# Patient Record
Sex: Male | Born: 1935 | Race: White | Hispanic: No | Smoking: Never smoker
Health system: Southern US, Community
[De-identification: ages and names within clinical notes are randomized; demographics above are authoritative.]

## PROBLEM LIST (undated history)

## (undated) DIAGNOSIS — E785 Hyperlipidemia, unspecified: Secondary | ICD-10-CM

## (undated) DIAGNOSIS — I10 Essential (primary) hypertension: Secondary | ICD-10-CM

## (undated) DIAGNOSIS — G620 Drug-induced polyneuropathy: Principal | ICD-10-CM

## (undated) DIAGNOSIS — I639 Cerebral infarction, unspecified: Secondary | ICD-10-CM

## (undated) DIAGNOSIS — Z950 Presence of cardiac pacemaker: Secondary | ICD-10-CM

## (undated) DIAGNOSIS — F32A Depression, unspecified: Secondary | ICD-10-CM

## (undated) DIAGNOSIS — F329 Major depressive disorder, single episode, unspecified: Secondary | ICD-10-CM

## (undated) DIAGNOSIS — R569 Unspecified convulsions: Secondary | ICD-10-CM

## (undated) DIAGNOSIS — I509 Heart failure, unspecified: Secondary | ICD-10-CM

## (undated) DIAGNOSIS — C61 Malignant neoplasm of prostate: Secondary | ICD-10-CM

## (undated) DIAGNOSIS — F0390 Unspecified dementia without behavioral disturbance: Secondary | ICD-10-CM

## (undated) HISTORY — DX: Presence of cardiac pacemaker: Z95.0

## (undated) HISTORY — DX: Drug-induced polyneuropathy: G62.0

## (undated) HISTORY — DX: Malignant neoplasm of prostate: C61

---
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
## 1999-04-21 ENCOUNTER — Encounter: Payer: Self-pay | Admitting: Emergency Medicine

## 1999-04-21 ENCOUNTER — Emergency Department (HOSPITAL_COMMUNITY): Admission: EM | Admit: 1999-04-21 | Discharge: 1999-04-21 | Payer: Self-pay | Admitting: Emergency Medicine

## 1999-04-28 ENCOUNTER — Emergency Department (HOSPITAL_COMMUNITY): Admission: EM | Admit: 1999-04-28 | Discharge: 1999-04-28 | Payer: Self-pay | Admitting: Emergency Medicine

## 2001-04-18 HISTORY — PX: PROSTATE BIOPSY: SHX241

## 2001-09-07 ENCOUNTER — Encounter: Admission: RE | Admit: 2001-09-07 | Discharge: 2001-09-07 | Payer: Self-pay | Admitting: Urology

## 2001-09-07 ENCOUNTER — Encounter: Payer: Self-pay | Admitting: Urology

## 2001-09-17 ENCOUNTER — Ambulatory Visit: Admission: RE | Admit: 2001-09-17 | Discharge: 2001-12-16 | Payer: Self-pay | Admitting: Radiation Oncology

## 2002-01-28 ENCOUNTER — Ambulatory Visit: Admission: RE | Admit: 2002-01-28 | Discharge: 2002-01-28 | Payer: Self-pay | Admitting: Radiation Oncology

## 2003-04-19 DIAGNOSIS — I639 Cerebral infarction, unspecified: Secondary | ICD-10-CM

## 2003-04-19 HISTORY — DX: Cerebral infarction, unspecified: I63.9

## 2003-07-02 ENCOUNTER — Encounter: Admission: RE | Admit: 2003-07-02 | Discharge: 2003-07-02 | Payer: Self-pay | Admitting: Family Medicine

## 2003-07-13 ENCOUNTER — Emergency Department (HOSPITAL_COMMUNITY): Admission: EM | Admit: 2003-07-13 | Discharge: 2003-07-13 | Payer: Self-pay | Admitting: Emergency Medicine

## 2004-04-07 ENCOUNTER — Inpatient Hospital Stay (HOSPITAL_COMMUNITY): Admission: EM | Admit: 2004-04-07 | Discharge: 2004-04-13 | Payer: Self-pay | Admitting: Emergency Medicine

## 2004-04-07 ENCOUNTER — Encounter (INDEPENDENT_AMBULATORY_CARE_PROVIDER_SITE_OTHER): Payer: Self-pay | Admitting: Cardiology

## 2004-04-08 ENCOUNTER — Ambulatory Visit: Payer: Self-pay | Admitting: Physical Medicine & Rehabilitation

## 2004-04-13 ENCOUNTER — Inpatient Hospital Stay
Admission: RE | Admit: 2004-04-13 | Discharge: 2004-04-21 | Payer: Self-pay | Admitting: Physical Medicine & Rehabilitation

## 2004-05-31 ENCOUNTER — Encounter: Admission: RE | Admit: 2004-05-31 | Discharge: 2004-07-29 | Payer: Self-pay | Admitting: Family Medicine

## 2004-06-02 ENCOUNTER — Encounter
Admission: RE | Admit: 2004-06-02 | Discharge: 2004-08-27 | Payer: Self-pay | Admitting: Physical Medicine & Rehabilitation

## 2004-06-03 ENCOUNTER — Ambulatory Visit: Payer: Self-pay | Admitting: Physical Medicine & Rehabilitation

## 2004-08-27 ENCOUNTER — Encounter
Admission: RE | Admit: 2004-08-27 | Discharge: 2004-11-25 | Payer: Self-pay | Admitting: Physical Medicine & Rehabilitation

## 2004-08-31 ENCOUNTER — Ambulatory Visit: Payer: Self-pay | Admitting: Physical Medicine & Rehabilitation

## 2007-01-24 ENCOUNTER — Emergency Department (HOSPITAL_COMMUNITY): Admission: EM | Admit: 2007-01-24 | Discharge: 2007-01-24 | Payer: Self-pay | Admitting: General Surgery

## 2010-09-03 NOTE — Discharge Summary (Signed)
[name] Holmes, [name] Holmes NO.:  [id number]   MEDICAL RECORD NO.:  [id number]          PATIENT TYPE:  INP   LOCATION:  3017                         FACILITY:  [facility]   PHYSICIAN:  Deirdre Peer. Polite, M.D. DATE OF BIRTH:  1924/08/14   DATE OF ADMISSION:  04/06/2004  DATE OF DISCHARGE:  04/12/2004                                 DISCHARGE SUMMARY   DISCHARGE DIAGNOSES:  1.  Acute right brain cerebrovascular accident with minimal paresis mainly      manifested by gait ataxia.  Seen in consultation by neurology,      rehabilitation recommended.  2.  Hypertension.  3.  Known history of seizure disorder.  4.  Elevated homocystine level.  5.  Hyperlipidemia.  6.  Probable gout in left great toe, on empiric treatment.  Please note,      uric acid within normal limits, as the patient has had some troubles      with falls and x-ray will be ordered to rule out fracture.  Results      pending at time of this dictation.   MEDICATIONS:  1.  Dilantin 200 mg q.h.s.  2.  Hydrochlorothiazide 12.5 mg p.o. daily.  3.  Lisinopril 20 mg daily.  4.  Aggrenox one daily until April 21, 2004, and then one p.o. b.i.d.  5.  Zocor 20 mg daily.  6.  Protonix one tab daily.  7.  Tranxene 7.5 mg q.h.s. p.r.n.  8.  Colchicine 0.6 mg p.o. daily.  9.  Indocin 50 mg one p.o. t.i.d. x5 days.   DISPOSITION:  The patient is awaiting acceptance to rehabilitation.   CONSULTATIONS:  Dr. Thad Ranger, neurology.   STUDIES:  The patient had a carotid ultrasound which showed no significant  carotid stenosis.  Urinalysis was negative.  MRI showed cerebrovascular  accident in the right parietal area, showed stenotic disease in both  internal carotid arteries, precavernous and cavernous regions, multiple  small carotid artery aneurysms.  Lipids were checked:  Total cholesterol  205, LDL 120, HDL 53.  BMET within normal limits.  Homocystine level was  elevated at 17.  Ultrasound of the left leg was negative  for DVT.   HISTORY OF PRESENT ILLNESS:  A 77 year old male with known medical problems  of hypertension and seizure disorder presented for evaluation for complaint  of feeling dizzy and frequent falls.  In the ED, the patient was evaluated.  Admission was deemed necessary for concern for CVA.  Please see dictated H&P  for further details.   PAST MEDICAL HISTORY:  As stated above.   MEDICATIONS ON ADMISSION:  As stated in the H&P.   PAST MEDICAL HISTORY:  1.  Asthma.  2.  Hypertension.  3.  History of prostate CA diagnosed in 2003, treated with radiation for      eight weeks.   HOSPITAL COURSE:  #1 -  The patient was admitted to a medical floor bed for  evaluation and treatment of possible CVA.  The patient had multiple studies,  as stated above.  CAT scan, carotid ultrasound, MRI, 2-D echocardiogram,  homocystine  level.  The patient's carotid ultrasound was negative for  stenosis.  The patient's 2-D echocardiogram was a limited study, unable to  assess left ventricular function.  MRI significant for right parietal  cerebrovascular accident.  The patient was also seen in consultation by  speech therapy who recommended a regular diet.  The patient was also seen in  consultation by cardiology.  It was recommended the patient have aggressive  risk factor modification.  Aggrenox was added, as well as lipid-lowering  agent was added to the patient's treatment regimen.  The patient was seen  also in consultation by rehabilitation who felt that the patient could  benefit from rehabilitation.  At the time of this dictation, the patient is  awaiting a bed offer in rehabilitation.  #2 -  SEIZURE DISORDER:  Stable.  The patient will continue Dilantin.  #3 -  PROBABLE GOUT:  Treated empirically with NSAID and colchicine.  Please  note, uric acid was within normal limits.  #4 -  ELEVATED LIPIDS:  Will be treated as outlined.  Recommend followup  LFT's.  #5 -  ELEVATED HOMOCYSTINE:  Hold  Foltx____________treatment regimen.      [name] Holmes   RDP/MEDQ  D:  04/12/2004  T:  04/12/2004  Job:  [id number]

## 2010-09-03 NOTE — Assessment & Plan Note (Signed)
MEDICAL RECORD NUMBER:  [id number]   DATE OF BIRTH:  Aug 19, 1924   DATE OF SERVICE:  July 01, 2004   INTERVAL HISTORY:  [age]-year-old male with right subcortical CVA  causing left greater than right hemiataxia and truncal ataxia; he was at  [facility] from April 13, 2004 to April 21, 2004 discharged at a  modified independent level; has been going to outpatient therapy; he is  accompanied by his niece who is his Midwife.  He lives  independently, he is a widower and worked up until about a year ago.   He continues to walk outside the home even close to a mile at a time.   He has had no change in review of systems, past medical history or medical  history since last treatment.  He has been getting physical therapy twice a  week at [facility].   EXAMINATION:  Blood pressure 146/81, pulse 72, respiratory rate 17, O2  saturation 96% room air.   General:  Elderly male; kyphotic posture; left medial rectus palsy evident.  He has 4/5 strength bilateral upper and lower extremities.  He has mild  ataxia on the right side compared to left.   Ambulation - is using a cane; he actually is able to put the cane down; he  has some mild valgus deformity bilateral knees, crouched gait, no loss of  balance.   IMPRESSION:  1.  Balance disorder secondary to cerebrovascular accident still with mild      ataxia.  2.  History of left foot gout, no recurrence but does take suppressive      colchicine 0.6 once daily.   PLAN:  1.  Continue PT x1 more month.  I will see him back in 1 month.  2.  Advised to hold colchicine and he can resume if he starts getting some      more toe pain.      AEK/MedQ  D:  07/01/2004 17:10:09  T:  07/01/2004 20:49:44  Job #:  [id number]

## 2010-09-03 NOTE — H&P (Signed)
NAMETRACE, WIRICK NO.:  [id number]   MEDICAL RECORD NO.:  [id number]          PATIENT TYPE:  EMS   LOCATION:  MAJO                         FACILITY:  [facility]   PHYSICIAN:  Sherin Quarry, MD      DATE OF BIRTH:  27-Oct-1924   DATE OF ADMISSION:  04/06/2004  DATE OF DISCHARGE:                                HISTORY & PHYSICAL   HISTORY OF PRESENT ILLNESS:  [name] Holmes is a 77 year old man who indicates  that he has a past history of seizure disorder but has had no seizures for  the last 13 years.  He is maintained chronically on Dilantin 200 mg  alternating with 300 mg daily. The dose of Dilantin has not been changed.  One week ago he began to be increasingly weak, dizzy and ataxic. In the last  48 hours he has become extremely ataxic and is unable to get out of a chair  or bed. Evaluation in the emergency room reveals severe wide based ataxia  with falling when the patient is not supported.  A CT scan of the head was  negative for stroke but showed right internal carotid artery disease may  possibly be present. The patient lives by himself and currently cannot care  for himself, and is therefore admitted for evaluation of the ataxia  symptoms.   PAST MEDICAL HISTORY:   CURRENT MEDICATIONS:  1.  Dilantin 200 mg alternating with 300 mg daily.  2.  Lisinopril 20 mg daily.  3.  Tranxene 7.5 mg daily.  4.  Hydrochlorothiazide 25 mg daily.  5.  Darvocet p.r.n. for pain.   MEDICAL ILLNESSES:  1.  Asthma. The patient has a past history of asthma with apparently has not      been bothering him very much since Toprol was discontinued.  2.  Prostate cancer. In 2003 the patient was diagnosed with prostate cancer      and received 8 weeks of radiation treatment. This has apparently been      supervised by Dr. Patsi Sears.  3.  Seizures. The history here is somewhat unclear but as mentioned      previously there have been no recent seizures.   OPERATIONS:  The patient  had an appendectomy in 1939, he also had a prostate  biopsy by Dr. Patsi Sears in 2003.   FAMILY HISTORY:  A sister who lives in [place] has arthritis and obesity.  He also has two nieces.   SOCIAL HISTORY:  He states he does not smoke. He denies use of alcohol or  drugs. As mentioned previously he lives by himself and it sounds like his  ability to care for himself under normal circumstances is somewhat tenuous.   REVIEW OF SYSTEMS:  Is otherwise negative. He has had no recent problems  with headache, visual blurring, nausea or vomiting.   PHYSICAL EXAMINATION:  VITAL SIGNS: Blood pressure is 132/80, pulse is 93,  respirations are 20.  HEENT:  Exam is within normal limits.  CHEST:  Clear to auscultation and percussion.  CARDIOVASCULAR:  Normal S1, S2, no murmurs, rubs, or gallops.  ABDOMEN:  Benign, normal bowel sounds without masses or tenderness. There is  no guarding or rebound.  NEUROLOGIC TESTING:  Cranial nerves, motor, sensory and cerebellar testing  is unremarkable except that on cerebellar testing the patient exhibits past  pointing when using his left arm. It is hard to tell whether this is  secondary to weakness or to difficulty with dysmetria.  On examination of  the patient's gait he is wide based, ataxic and is unable to walk without  assistance.  EXTREMITIES:  No cyanosis or edema.   LABORATORY DATA:  Dilantin level is 16.9. White count is 13,800, hemoglobin  14.2. Glucose is 119, BUN is 23, electrolytes are normal.  CT scan of the  brain showed no acute disease, it did however suggest the possibility of  some right carotid artery disease.   IMPRESSION:  1.  Ataxia, severe x1 week, rule out stroke.  2.  History of seizures, Dilantin therapy, no seizures x13 years.  3.  Hypertension.  4.  History of asthma.  5.  Chronic anxiety.  6.  History of prostate cancer, radiation therapy.   PLAN:  The patient will be admitted at this time for further evaluation. An   MRI scan of the brain will be obtained. Will also do an MRA scan and 2D  echocardiogram, carotid Doppler studies will be obtained.  I am going to  check a free Dilantin level, I think it might be reasonable to hold the  patient's Dilantin at least until the Dilantin level gets down to the 10  range and then it might be a consideration to stop Dilantin altogether since  he has had no seizures.  PT, OT and case manager consults will be obtained.       SY/MEDQ  D:  04/07/2004  T:  04/07/2004  Job:  [id number]   cc:   L. Lupe Carney, M.D.  [address] [place]  [place] [address]  Fax: [phone]

## 2010-09-03 NOTE — Discharge Summary (Signed)
NAMESABASTION, HRDLICKA                ACCOUNT NO.:  [id number]   MEDICAL RECORD NO.:  [id number]          PATIENT TYPE:  ORB   LOCATION:  4527                         FACILITY:  [facility]   PHYSICIAN:  Erick Colace, M.D.DATE OF BIRTH:  [birth year]   DATE OF ADMISSION:  04/13/2004  DATE OF DISCHARGE:  04/21/2004                                 DISCHARGE SUMMARY   DISCHARGE DIAGNOSES:  1.  Right cerebrovascular accident.  2.  Elevated homocysteine.  3.  Elevated cholesterol.  4.  History of hypertension.  5.  Left foot gout.  6.  History of seizure disorder.   HISTORY AND PHYSICAL:  The patient is a 77 year old white male with past  medical history of seizure disorders on chronic Dilantin who presents on  04/06/04 with elevated weakness, dizziness and ataxia.  Head CT is negative  for CVA.  Carotid Doppler, no significant ACS stenosis.  MRI revealed a  right parotid globe and CVA and transcranial Doppler is pending at the time  of this dictation.  PT at this time, the patient is transferring 150 feet  with rolling walker, ambulating with supervision level.  Bed mobility  supervision level.  Transfers supervision level.  Venous Doppler's are  performed on 04/11/04 and demonstrated no DVT.  The patient also had  exacerbation of gout while hospitalized.  The patient was transferred to  [facility] on 04/13/04 for more therapy.   PAST MEDICAL HISTORY:  1.  Asthma.  2.  Prostate cancer.  3.  Seizure disorder.  4.  Hypertension.  5.  Chronic anxiety.   PAST SURGICAL HISTORY:  Appendectomy.   FAMILY HISTORY:  Mother deceased from CVA.  Father deceased from CVA.  One  sister living and healthy.   SOCIAL HISTORY:  The patient lives alone in a one-level home in [place],  [place].  Four steps to enter.  Independent prior to admission.  Has  step-children, sister and nieces locally.   MEDICATIONS PRIOR TO ADMISSION:  1.  Toprol XL 100 mg daily.  2.  Dilantin 200 mg daily.  3.  HCTZ 25 mg daily.  4.  Aspirin 81 mg daily.  5.  Lisinopril unknown dose daily.   ALLERGIES:  ACTONEL.   HOSPITAL COURSE:  Mr. Ostin Mathey was admitted to [facility] subacute  department on 04/13/04 where he received more than hour of therapy.  Overall  Mr. Ulice Bold progressed very well while in subacute.  He had no new  neurological deficits while in rehab.  He was discharged at a modified  independent level, able to ambulate greater than 150 feet with a rolling  walker.  The patient remained on Aggrenox for CVA prophylaxis.  No bleeding  complications noted.  The patient had no significant weakness in  extremities.  Occasional slurred speech but this was possibly baseline.  The  patient continued to take Foltx daily for elevated homocysteine.  The  patient also continued to take Dilantin as needed for seizure disorder.  The  patient had no seizures while in rehab.  The patient was on Lovenox 40 mg  daily for DVT prophylaxis.  This was discontinued on 04/21/04.  The patient  met all his goals in rehab and it was thought he could be discharged in the  modified level.   The patient also remained on Zocor 20 mg at night for history of elevated  cholesterol.  Liver function tests remained stable.  His latest AST was 17,  ALT 18, alkaline phosphatase 92.  The patient remained on Indocin as well as  colchicine for gout.  Indocin was discontinued.  The patient continued to  take colchicine 0.6 mg daily at home.  Blood pressure remained under  reasonably good control on HCTZ 12.5 mg daily as well as lisinopril 20 mg  daily.  He is to continue to monitor blood pressure at discharge.  Occasionally, will exacerbate gout.  The patient still has ataxia on the  left and motor skills were 5/5 bilateral upper extremities and bilateral  lower extremities.  The patient completed all goals.  He was recommended for  discharge home on 04/22/03.  There were no medical issues during  rehabilitation.   Latest labs were sodium level 135, potassium 3.8, chloride  102, CO2 26, glucose 130, BUN 19, creatinine 1.2.  Latest hemoglobin 11.7,  hematocrit 32.8, white blood cell count 7.8, platelet count 384,000.  Hemoccult swab of stool was negative on 04/14/04.  The patient also had a  Dilantin level on 04/14/04 that was 7.0.  Culture on 04/13/04 ____________.   At the time of discharge all vitals were stable.  Blood pressure 116/52,  respiratory rate 20, pulse 74, temperature 98.2.  At the time of discharge  the patient is able to ambulate approximately 300 feet with walker,  transfers modified independently, bed mobility modified independently.  Able  to perform all ADLs modified independently.  PT thought the patient was safe  to be at home by himself.  He was discharged on overall modified independent  level.  Due to family concerns the patient will have social worker regarding  safety issues.  At the time of discharge the patient's physical exam was  basically unremarkable.  Discharge medications include colchicine 0.6 mg one  tablet daily, Dilantin 1 mg two tabs at bedtime, HCTZ 12.5 mg daily,  Prinivil 20 mg daily, Aggrenox 1 p.o. twice daily, Zocor 20 mg one tablet at  bedtime, Foltx 1 a day.  Do not take aspirin or Toprol.  Use walker.  No  driving, no  drinking alcohol, no smoking.  Drink plenty of liquids and limit caffeine.  Diet is low cholesterol, low fat.  He is to receive Home Health for PT/OT,  nurse and social worker.  Follow up with Dr. Lupe Carney in 2-6 weeks.  Follow up with Dr. Wynn Banker on 05/31/04 at 2:45.  Follow up with Dr.  Sharene Skeans in two weeks, call for appointment.     Loyc   LB/MEDQ  D:  04/21/2004  T:  04/21/2004  Job:  [id number]   cc:   Deanna Artis. Sharene Skeans, M.D.  [address]  [address]  [place]  [place] [address]  Fax: [phone]   Sigmund I. Patsi Sears, M.D.  [address] [place]  [place] [address]  Fax: [phone]   L. Lupe Carney, M.D.  [address] [place]  [place] [address]  Fax: [phone]

## 2010-09-03 NOTE — Consult Note (Signed)
[name] Holmes, [name] Holmes                ACCOUNT NO.:  [id number]   MEDICAL RECORD NO.:  [id number]          PATIENT TYPE:  INP   LOCATION:  3017                         FACILITY:  [facility]   PHYSICIAN:  Casimiro Needle L. Reynolds, M.D.DATE OF BIRTH:  1925/01/17   DATE OF CONSULTATION:  04/07/2004  DATE OF DISCHARGE:                                   CONSULTATION   REQUESTING PHYSICIAN:  Deirdre Peer. Polite, M.D.   REASON FOR EVALUATION:  Stroke.   HISTORY OF PRESENT ILLNESS:  This is an inpatient consultation evaluation  for this existing [facility] patient, a 77 year old man  followed in our office for many years for epilepsy which has been extremely  well controlled on Dilantin.  Per his report, he has taken Dilantin for over  50 years, and he has not had a seizure in about 13 years.  Our records  indicate that his last seizure was in February 1994.  According to our  notes, the patient does have a known organic gait disorder, although I do  not have precise details on that.  The patient was brought to the emergency  room yesterday by a friend because he was unable to get around well in his  house.  He reports that he has had an event a week ago when he was out near  his trash can and his legs suddenly became weak.  However, this was  transient, and he felt well a couple days after that.  Then, four days ago  he awoke in the morning and lost his balance and fell, hitting his head  against the table.  From that time forward, he has noticed that he has not  been getting around very well.  For instance, he walked to a restaurant and  had to be helped into the restaurant by a hostess.  Yesterday, a friend came  over and noted that he was having difficulty.  The friend brought him some  Ensure, which he was able to drink but which did not help very much, and he  was brought in for further evaluation.  The patient denies any particular  focal weakness or numbness or any loss of  consciousness.   PAST MEDICAL HISTORY:  1.  Remarkable for a long history of seizure as above.  2.  He also has a history of hypertension, but denies any problems with high      cholesterol, high blood sugars, or heart disease.  3.  He has a remote history of prostate cancer.   FAMILY/SOCIAL/REVIEW OF SYSTEMS:  As outlined in the admission H&P from  earlier today.   MEDICATIONS:  1.  Prior to admission, he was taking Toprol 100 mg daily.  2.  Tranxene 7.5 mg at bedtime.  3.  Dilantin 200 mg at bedtime alternating with 300 mg at bedtime.  4.  HCTZ.  5.  Aspirin.  6.  Lisinopril.   PHYSICAL EXAMINATION:  TEMPERATURE:  97.1.  BLOOD PRESSURE:  139/77.  PULSE:  87.  RESPIRATIONS:  18.  O2 SATURATION:  91% on room air.  GENERAL:  This  is a frail-appearing man supine on the hospital bed in no  evident distress.  HEAD:  Cranium is normocephalic and atraumatic.  Oropharynx is benign.  NECK:  Supple, without carotid bruits.  HEART:  Regular rate and rhythm, without murmurs.  NEUROLOGIC:  Mental status:  He is awake and alert.  He is oriented to time,  place, and person.  Recent and remote memory are intact.  Attention span,  concentration, and fund of knowledge are appropriate.  Speech is fluent and  not dysarthric.  He has no difficulty in naming objects and can repeat  without difficulty.  His speech is not particularly dysarthric.  Cranial  nerves II-XII:  Funduscopic exam:  Disks benign.  Pupils equal and briskly  reactive.  There is a right exotropia which is longstanding, according to  our notes, but extraocular movements are full, without nystagmus, and visual  fields are full to confrontation.  Hearing is intact to finger rub.  Facial  sensation is diminished a little bit on the left side to pinprick.  There is  no facial asymmetry, but there is definite facial weakness on the left  compared to the right.  Tongue and palate move in the midline.  Motor:  Normal bulk and tone.  A  mild left hemiparesis is present, left arm greater  than leg.  Sensation:  Diminished pinprick sensation on the left leg versus  the right, although both feet are diminished a little bit.  Pinprick in the  upper extremities is intact.  Coordination:  Rapid movements are performed  well.  There is a little bit of dysmetria on finger-to-nose testing on the  left, not on the right.  Gait:  He arises from a chair slowly.  His stance  is wide based.  He has great difficulty standing with a narrow base or  performing a Romberg maneuver.  He requires moderate assistance to walk and  appears to favor his right leg when walking.   LABORATORY REVIEW:  CBC:  White count 13.8, hemoglobin 14.2.  Chemistries  are remarkable for BUN and creatinine of 23 and 1.3, respectively, along  with a glucose of 119.  CT of the head was personally reviewed.  The study  demonstrates a mild atrophy, a significant ventriculomegaly out of  proportion to the atrophy, and some small vessel disease, but no acute  findings.  MRI and MRA of the brain is also personally reviewed, and this  does indicate a couple of small cortical strokes in the right frontoparietal  area with some bilateral distal carotid disease, right greater than left.  Preliminary carotid Doppler report is negative.  Echocardiogram report is  pending.   IMPRESSION:  1.  Acute right brain stroke with mild left hemiparesis and sensory changes.  2.  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  However, I do not know how long his      imaging studies have looked this way.   PLAN:  I would recommend reducing his Dilantin to 200 mg at bedtime and following free levels.  Of note, he did have a level of 17, which was drawn  in the evening, and that is a pretty  high trough level for an older  gentleman.  It may be wise  to lower the medication even further.  As far as  stroke, his routine stroke workup is underway, and he has been seen by the  therapist.  Would add a transcranial Doppler and a rehab consult as well as  a fasting lipid panel and homocysteine levels.  Stroke service to follow.  Thank you for the consultation.       MLR/MEDQ  D:  04/07/2004  T:  04/08/2004  Job:  [id number]

## 2010-09-03 NOTE — Group Therapy Note (Signed)
MEDICAL RECORD NUMBER:  [id number]   REASON FOR VISIT:  CVA, balance disorder, now finished up with physical  therapy.  Date of last visit was July 01, 2004.   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  Truncal ataxia was at [facility] until  April 21, 2004.  Discharged at modified independent level.  Has completed  outpatient therapy.  About a month ago, he has had no new complications.  He  has no pain complaints. He walks 20 minutes at a time.  He can climb steps.  He does not drive.  He uses a cane.  He has some numbness in the fingers and  left hand.  No other changes.   REVIEW OF SYSTEMS:  Since last visit, no new studies or new physicians.  He  has had some decreased hearing in the left ear as of July 31, 2004.   PHYSICAL EXAMINATION:  VITAL SIGNS:  Blood pressure 132/87, pulse 68,  respirations 18, O2 saturation 93% on room air.  NEUROLOGICAL:  Gait:  He has no toe drag or knee instability.  He is no  longer using a cane inside the house.  He has positive dysdiadochokinesis on  rapid alternating supination, pronation, left upper extremity greater than  right upper extremity.  His left finger-to-nose-to-finger has some moderate  dysmetria.  Right finger-to-nose-to-finger has minimal dysmetria.  The heel-  to-shin has minimal dysmetria on the left side and normal on the right side.  His motor strength is 4+/5 bilateral deltoid, biceps, triceps and grip, as  well as, hip flexion, knee extension and dorsiflexion.   IMPRESSION:  Cerebrovascular accident producing subcortical hemiataxia left  side, improving, functionally at an independent level, no falls, no other  complications.   PLAN:  Will follow up with Dr. Lupe Carney for primary care and Dr. Billie Ruddy for his neurologic care.  I will see him back on a p.r.n. basis.      AEK/MedQ  D:  08/31/2004 12:08:04  T:  08/31/2004 12:28:49  Job #:   [id number]   cc:   Deanna Artis. Sharene Skeans, M.D.  [address]  [address]  [place]  [place] [address]  Fax: [phone]   L. Lupe Carney, M.D.  [address] [place]  [place] [address]  Fax: [phone]

## 2010-09-03 NOTE — Assessment & Plan Note (Signed)
DATE OF BIRTH:  1924-09-03   MEDICAL RECORD NUMBER:  [id number]   HISTORY:  [name] is a 77 year old male with right subcortical CVA causing  left greater than right hemiataxia/truncal ataxia.  He was at [facility] from April 13, 2004 to April 21, 2004 discharged home at a  modified independent level using a walker, home health has come out, they  have upgraded him to a cane.  He missed about 2-week lapse in time between  home health and outpatient therapy per his Power of Healthcare Attorney who  is also his niece.   He has had no new medical complications in the interval period of time.  He  has seen Dr. August Saucer.   MEDICATIONS:  Medications include colchicine, hydrochlorothiazide,  lisinopril, Aggrenox, Foltx, Dilantin and Zocor.   ALLERGIES:  ACTONEL.   COMPLAINTS:  No pain complaints.   SOCIAL HISTORY:  He lives alone, widower, last worked sometime in February  2005.   REVIEW OF SYSTEMS:  Seizure disorder 1991 last seizure, some confusion  related to stroke, hearing loss, poor sleep, anxiety, no falls.   EXAMINATION:  Blood pressure 120/66, pulse 66, respiratory rate is 18.  Ambulates with a cane.  Affect is alert, bright, appearance is otherwise as  an elderly male with slight kyphotic posture.  He does have valgus deformity  at the right knee when ambulating.  Motor strength is 4/5 bilateral  deltoids, biceps, triceps, grip as well as hip flexion, knee extension,  ankle dorsiflexion.   He has moderate deficits left finger-nose-finger, mild deficits right finger-  nose-finger.  Heel-to-shin had mild deficits bilaterally.   Deep tendon reflexes are 2+ bilateral upper and lower extremities.  No  evidence of clonus.  No passive resistance of tone.   IMPRESSION:  Right subcortical cerebrovascular accident causing left greater  than right hemiataxia as well as truncal ataxia.   PLAN:  1.  Continue outpatient therapy.  2.  I will see him back in 1 month.  3.  No change in medications from my standpoint.      AEK/MedQ  D:  06/03/2004 16:32:59  T:  06/03/2004 19:58:31  Job #:  [id number]   cc:   L. Lupe Carney, M.D.  [address] [place]  [place] [address]  Fax: [phone]   Deanna Artis. Sharene Skeans, M.D.  [address]  [address]  [place]  [place] [address]  Fax: [phone]   Sigmund I. Patsi Sears, M.D.  [address]  [place]  [place] [address]  Fax: [phone]

## 2010-11-08 ENCOUNTER — Emergency Department (HOSPITAL_COMMUNITY): Payer: Medicare Other

## 2010-11-08 ENCOUNTER — Encounter (HOSPITAL_COMMUNITY): Payer: Self-pay

## 2010-11-08 ENCOUNTER — Inpatient Hospital Stay (HOSPITAL_COMMUNITY)
Admission: EM | Admit: 2010-11-08 | Discharge: 2010-11-15 | DRG: 093 | Disposition: A | Discharge status: Rehabilitation Facility | Payer: Medicare Other | Attending: Internal Medicine | Admitting: Internal Medicine

## 2010-11-08 DIAGNOSIS — E876 Hypokalemia: Secondary | ICD-10-CM | POA: Diagnosis not present

## 2010-11-08 DIAGNOSIS — L570 Actinic keratosis: Secondary | ICD-10-CM | POA: Diagnosis present

## 2010-11-08 DIAGNOSIS — I10 Essential (primary) hypertension: Secondary | ICD-10-CM | POA: Diagnosis present

## 2010-11-08 DIAGNOSIS — R29810 Facial weakness: Secondary | ICD-10-CM | POA: Diagnosis present

## 2010-11-08 DIAGNOSIS — W19XXXA Unspecified fall, initial encounter: Secondary | ICD-10-CM | POA: Diagnosis present

## 2010-11-08 DIAGNOSIS — T420X5A Adverse effect of hydantoin derivatives, initial encounter: Secondary | ICD-10-CM | POA: Diagnosis present

## 2010-11-08 DIAGNOSIS — Z9181 History of falling: Secondary | ICD-10-CM

## 2010-11-08 DIAGNOSIS — N400 Enlarged prostate without lower urinary tract symptoms: Secondary | ICD-10-CM | POA: Diagnosis present

## 2010-11-08 DIAGNOSIS — G40909 Epilepsy, unspecified, not intractable, without status epilepticus: Secondary | ICD-10-CM | POA: Diagnosis present

## 2010-11-08 DIAGNOSIS — Z8673 Personal history of transient ischemic attack (TIA), and cerebral infarction without residual deficits: Secondary | ICD-10-CM

## 2010-11-08 DIAGNOSIS — R279 Unspecified lack of coordination: Principal | ICD-10-CM | POA: Diagnosis present

## 2010-11-08 DIAGNOSIS — Z8546 Personal history of malignant neoplasm of prostate: Secondary | ICD-10-CM

## 2010-11-08 HISTORY — DX: Cerebral infarction, unspecified: I63.9

## 2010-11-08 LAB — URINALYSIS, ROUTINE W REFLEX MICROSCOPIC
Glucose, UA: NEGATIVE mg/dL
Hgb urine dipstick: NEGATIVE
Leukocytes, UA: NEGATIVE
Nitrite: NEGATIVE
Protein, ur: NEGATIVE mg/dL
Specific Gravity, Urine: 1.011 (ref 1.005–1.030)
Urobilinogen, UA: 0.2 mg/dL (ref 0.0–1.0)
pH: 6.5 (ref 5.0–8.0)

## 2010-11-08 LAB — CK TOTAL AND CKMB (NOT AT ARMC)
CK, MB: 3.2 ng/mL (ref 0.3–4.0)
Total CK: 45 U/L (ref 7–232)

## 2010-11-09 ENCOUNTER — Inpatient Hospital Stay (HOSPITAL_COMMUNITY): Payer: Medicare Other

## 2010-11-09 ENCOUNTER — Other Ambulatory Visit (HOSPITAL_COMMUNITY): Payer: Medicare Other

## 2010-11-09 DIAGNOSIS — R269 Unspecified abnormalities of gait and mobility: Secondary | ICD-10-CM

## 2010-11-09 LAB — CBC
HCT: 39.2 % (ref 39.0–52.0)
Hemoglobin: 14.4 g/dL (ref 13.0–17.0)
MCH: 35.2 pg — ABNORMAL HIGH (ref 26.0–34.0)
MCHC: 36.7 g/dL — ABNORMAL HIGH (ref 30.0–36.0)
MCV: 95.8 fL (ref 78.0–100.0)
Platelets: 253 10*3/uL (ref 150–400)
RBC: 4.09 MIL/uL — ABNORMAL LOW (ref 4.22–5.81)
RDW: 12.6 % (ref 11.5–15.5)
WBC: 6.8 10*3/uL (ref 4.0–10.5)

## 2010-11-09 LAB — CK TOTAL AND CKMB (NOT AT ARMC)
CK, MB: 2.8 ng/mL (ref 0.3–4.0)
Relative Index: INVALID (ref 0.0–2.5)
Relative Index: INVALID (ref 0.0–2.5)
Relative Index: INVALID (ref 0.0–2.5)
Total CK: 39 U/L (ref 7–232)
Total CK: 34 U/L (ref 7–232)
Total CK: 48 U/L (ref 7–232)

## 2010-11-09 LAB — GLUCOSE, CAPILLARY: Glucose-Capillary: 148 mg/dL — ABNORMAL HIGH (ref 70–99)

## 2010-11-09 LAB — PROTIME-INR
INR: 1.02 (ref 0.00–1.49)
Prothrombin Time: 13.6 seconds (ref 11.6–15.2)

## 2010-11-09 LAB — DIFFERENTIAL
Basophils Absolute: 0.1 10*3/uL (ref 0.0–0.1)
Basophils Relative: 1 % (ref 0–1)
Eosinophils Absolute: 0.2 10*3/uL (ref 0.0–0.7)
Eosinophils Relative: 2 % (ref 0–5)
Lymphocytes Relative: 18 % (ref 12–46)
Lymphs Abs: 1.2 10*3/uL (ref 0.7–4.0)
Monocytes Absolute: 0.7 10*3/uL (ref 0.1–1.0)
Monocytes Relative: 10 % (ref 3–12)
Neutro Abs: 4.7 10*3/uL (ref 1.7–7.7)
Neutrophils Relative %: 69 % (ref 43–77)

## 2010-11-09 LAB — COMPREHENSIVE METABOLIC PANEL
ALT: 15 U/L (ref 0–53)
AST: 20 U/L (ref 0–37)
Albumin: 3.7 g/dL (ref 3.5–5.2)
CO2: 27 mEq/L (ref 19–32)
Calcium: 9.5 mg/dL (ref 8.4–10.5)
Chloride: 100 mEq/L (ref 96–112)
Creatinine, Ser: 1.13 mg/dL (ref 0.50–1.35)
Potassium: 3.5 mEq/L (ref 3.5–5.1)
Sodium: 138 mEq/L (ref 135–145)
Total Bilirubin: 0.4 mg/dL (ref 0.3–1.2)
Total Protein: 6.9 g/dL (ref 6.0–8.3)

## 2010-11-09 LAB — PHENYTOIN LEVEL, TOTAL
Phenytoin Lvl: 27.5 ug/mL — ABNORMAL HIGH (ref 10.0–20.0)
Phenytoin Lvl: 23.6 ug/mL — ABNORMAL HIGH (ref 10.0–20.0)

## 2010-11-09 LAB — TROPONIN I: Troponin I: <0.3 ng/mL (ref <0.30)

## 2010-11-09 LAB — PRO B NATRIURETIC PEPTIDE: Pro B Natriuretic peptide (BNP): 230 pg/mL (ref 0–450)

## 2010-11-10 LAB — URINE CULTURE: Culture  Setup Time: 201207240050

## 2010-11-10 LAB — PHENYTOIN LEVEL, TOTAL: Phenytoin Lvl: 21.3 ug/mL — ABNORMAL HIGH (ref 10.0–20.0)

## 2010-11-11 LAB — COMPREHENSIVE METABOLIC PANEL
Alkaline Phosphatase: 103 U/L (ref 39–117)
BUN: 26 mg/dL — ABNORMAL HIGH (ref 6–23)
CO2: 31 mEq/L (ref 19–32)
GFR calc Af Amer: >60 mL/min (ref >60)
GFR calc non Af Amer: 58 mL/min — ABNORMAL LOW (ref >60)
Glucose, Bld: 110 mg/dL — ABNORMAL HIGH (ref 70–99)
Potassium: 4.3 mEq/L (ref 3.5–5.1)
Total Bilirubin: 0.3 mg/dL (ref 0.3–1.2)
Total Protein: 6.5 g/dL (ref 6.0–8.3)

## 2010-11-11 LAB — PHENYTOIN LEVEL, TOTAL: Phenytoin Lvl: 15 ug/mL (ref 10.0–20.0)

## 2010-11-11 LAB — CBC
HCT: 38.8 % — ABNORMAL LOW (ref 39.0–52.0)
Hemoglobin: 13.9 g/dL (ref 13.0–17.0)
MCHC: 35.8 g/dL (ref 30.0–36.0)
RBC: 4.03 MIL/uL — ABNORMAL LOW (ref 4.22–5.81)

## 2010-11-12 LAB — DIFFERENTIAL
Basophils Absolute: 0.1 10*3/uL (ref 0.0–0.1)
Basophils Relative: 1 % (ref 0–1)
Eosinophils Absolute: 0.4 10*3/uL (ref 0.0–0.7)
Eosinophils Relative: 4 % (ref 0–5)
Monocytes Absolute: 1 10*3/uL (ref 0.1–1.0)
Monocytes Relative: 10 % (ref 3–12)
Neutro Abs: 6.2 10*3/uL (ref 1.7–7.7)

## 2010-11-12 LAB — COMPREHENSIVE METABOLIC PANEL
ALT: 14 U/L (ref 0–53)
Albumin: 3.3 g/dL — ABNORMAL LOW (ref 3.5–5.2)
Alkaline Phosphatase: 107 U/L (ref 39–117)
Calcium: 9.9 mg/dL (ref 8.4–10.5)
GFR calc Af Amer: >60 mL/min (ref >60)
Potassium: 3.8 mEq/L (ref 3.5–5.1)
Sodium: 141 mEq/L (ref 135–145)
Total Protein: 6.7 g/dL (ref 6.0–8.3)

## 2010-11-12 LAB — CBC
MCH: 34.5 pg — ABNORMAL HIGH (ref 26.0–34.0)
MCHC: 35.5 g/dL (ref 30.0–36.0)
Platelets: 277 10*3/uL (ref 150–400)
RDW: 12.8 % (ref 11.5–15.5)

## 2010-11-12 LAB — PHENYTOIN LEVEL, TOTAL: Phenytoin Lvl: 17.9 ug/mL (ref 10.0–20.0)

## 2010-11-13 LAB — CBC
HCT: 38.5 % — ABNORMAL LOW (ref 39.0–52.0)
Hemoglobin: 13.6 g/dL (ref 13.0–17.0)
MCHC: 35.3 g/dL (ref 30.0–36.0)
RBC: 4 MIL/uL — ABNORMAL LOW (ref 4.22–5.81)

## 2010-11-13 LAB — DIFFERENTIAL
Basophils Absolute: 0.1 10*3/uL (ref 0.0–0.1)
Basophils Relative: 1 % (ref 0–1)
Lymphocytes Relative: 19 % (ref 12–46)
Monocytes Absolute: 1.1 10*3/uL — ABNORMAL HIGH (ref 0.1–1.0)
Monocytes Relative: 12 % (ref 3–12)
Neutro Abs: 5.8 10*3/uL (ref 1.7–7.7)
Neutrophils Relative %: 65 % (ref 43–77)

## 2010-11-13 LAB — COMPREHENSIVE METABOLIC PANEL
ALT: 14 U/L (ref 0–53)
Alkaline Phosphatase: 104 U/L (ref 39–117)
BUN: 27 mg/dL — ABNORMAL HIGH (ref 6–23)
CO2: 29 mEq/L (ref 19–32)
GFR calc Af Amer: 57 mL/min — ABNORMAL LOW (ref >60)
GFR calc non Af Amer: 47 mL/min — ABNORMAL LOW (ref >60)
Glucose, Bld: 103 mg/dL — ABNORMAL HIGH (ref 70–99)
Potassium: 3.9 mEq/L (ref 3.5–5.1)
Sodium: 134 mEq/L — ABNORMAL LOW (ref 135–145)
Total Bilirubin: 0.2 mg/dL — ABNORMAL LOW (ref 0.3–1.2)

## 2010-11-13 LAB — PHENYTOIN LEVEL, TOTAL: Phenytoin Lvl: 17.4 ug/mL (ref 10.0–20.0)

## 2010-11-13 NOTE — Discharge Summary (Signed)
[name], [name] NO.:  [id number]  MEDICAL RECORD NO.:  [id number]  LOCATION:  3009                         FACILITY:  [facility]  PHYSICIAN:  Talmage Nap, MD  DATE OF BIRTH:  1924/09/15  DATE OF ADMISSION:  11/08/2010 DATE OF DISCHARGE:                        DISCHARGE SUMMARY - REFERRING   DATE OF DISCHARGE:  Undetermined.  However, the patient will be discharged as soon as rehab facility is obtained.  PRIMARY CARE PHYSICIAN:  Dr. Clovis Riley.  CONSULTANT INVOLVED IN THIS CASE:  Neurology, Dr. Thad Ranger.  DISCHARGE DIAGNOSES: 1. 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 2. Dilantin toxicity. 3. History of seizure disorder. 4. History of cerebrovascular accident. 5. Hypertension. 6. Actinic keratosis. 7. History of asthma. 8. History of prostate cancer. 9. History of recurrent falls.  The patient is an 77 year old Caucasian male with history of seizure disorder and recurrent falls, who was admitted to the hospital on November 08, 2010 by Dr. Mauro Kaufmann with history of unsteady gait and fall.  The patient was said to have denied any premonitory symptoms prior to the onset of fall.  He denied any chest pain or shortness of breath.  The patient was said to be having unsteady gait, observed to have right facial droop.  There was no weakness, seizures.  The patient was said to admit blurry vision, but denied any chest, denied any shortness of breath, denied any nausea or vomiting.  The gait was said to be persistently unsteady and subsequently brought to the emergency room to be evaluated.  PREADMISSION MEDICATIONS: 1. Calcium 600 mg two tablets p.o. daily. 2. Dilantin 100 mg two tablet daily at bedtime. 3. Dipyridamole 25 mg p.o. b.i.d. 4. Folic acid 1 mg p.o. daily. 5. Hydrochlorothiazide 25 mg p.o. daily. 6. Lisinopril 40 mg p.o. daily. 7. Simvastatin 20 mg p.o. daily.  ALLERGIES:  Has no known allergies.  PAST SURGICAL  HISTORY: 1. Appendectomy. 2. Cataract surgery. 3. Tonsillectomy.  SOCIAL HISTORY:  Negative for alcohol or tobacco use.  The patient lives at home by himself.  FAMILY HISTORY:  Not documented.  REVIEW OF SYSTEMS:  Essentially documented in the initial history and physical.  PHYSICAL EXAMINATION:  VITAL SIGNS:  At the time, the patient was seen by the admitting physician, vital signs; blood pressure was 121/71, temperature is 98.1, pulse 77, respiratory rate 17. HEENT:  Pupils were reactive to light and extraocular muscles are intact. NECK:  No jugular venous distention.  No carotid bruit.  No lymphadenopathy. CHEST:  Said to be clear to auscultation. HEART:  Sounds are 1 and 2. ABDOMEN:  Soft, nontender.  Liver, spleen, and kidney not palpable. Bowel sounds are positive.  Extremities show no pedal edema. NEUROLOGIC:  Showed cranial nerves II through XII intact.  Muscle power upper extremity 5/5, however, there was reduction in the lower extremity, especially on the right lower extremity. NEUROPSYCHIATRIC EVALUATION:  Unremarkable. SKIN:  Shows scaly, flaky lesions on the forehead as well as on the face and the upper extremity.  LAB DATA:  Initial cardiac marker done on the patient, troponin-I less than 0.30.  Dilantin level on admission was 26.6.  Pro BNP 230.  Initial complete blood  count with differential showed WBC of 6.8, hemoglobin of 40.4, hematocrit of 39.2.  MCV of 95.8, with a platelet count of 253, normal differentials.  Comprehensive metabolic panel showed sodium of 138, potassium of 3.5, chloride of 100 with a bicarb of 27, glucose is 147, BUN is 14, creatinine is 1.13, lipase level is 27.  Urine culture unremarkable.  Subsequent repeat of Dilantin levels are as follows, on November 10, 2010 is 21.3, on November 11, 2010 is 15.0, and on November 12, 2010 is 17.9.  A repeat complete blood count with differential done on November 12, 2010 showed WBC of 9.8, hemoglobin of 14.1,  hematocrit of 39.7, MCV of 97.1 with a platelet count of 277.  Comprehensive metabolic panel showed sodium of 141, potassium of 3.8, chloride of 102 with a bicarb of 30, glucose is 115, BUN is 26, creatinine 1.08.  IMAGING STUDIES DONE ON THE PATIENT: 1. Chest x-ray which showed no active cardiopulmonary disease.  There     is chronic changes with bibasilar interstitial lung disease. 2. CT of the head without contrast showed stable atrophic     microvascular ischemic changes and ventricular enlargement. 3. MRI of the brain without contrast done on November 09, 2010, showed     extensive atrial fib with ventriculomegaly, however, no acute     infarct seen.  HOSPITAL COURSE:  The patient was admitted to Neurology unit 3000 with an impression of transient left facial droop.  Dilantin was held and the patient was saline locked and this was monitored on a daily basis. Other medications given to the patient include calcium carbonate, Os-Cal one tablet p.o. b.i.d. with meals, folic acid 1 mg p.o. daily, hydrochlorothiazide 25 mg p.o. daily.  He was also restarted on lisinopril 40 mg p.o. daily as well as Zocor 20 mg p.o. at bedtime.  The patient was however seen by me for the very first time in this index admission on November 10, 2010, and during this encounter, he denied any specific complaint, but examination of the patient showed the patient to have scaly as well as flaky lesions on the head and neck as well as upper extremity.  An impression of actinic keratosis was made by me and subsequently the patient was started on hydrocortisone cream 1% to be applied b.i.d. to the head and affected part.  The patient was also evaluated by the physical therapist and subsequently had physical therapy done.  However, the patient did was said to be ataxic, therefore, he was recommended to go to rehab.  The patient was followed by me on a daily basis and also his Dilantin level was also monitored on a daily  basis.  On November 11, 2010.  Dilantin level was normalized and subsequently adjustment was made in the patient's Dilantin level.  He was started on Dilantin 100 mg p.o. b.i.d., which according to his family, he has been taking for the past 19 years.  When his Dilantin level was adjusted to a higher dose that was when the patient develop ataxic gait and her recurrent falls.  So far the patient has remained clinically stable.  I had an extensive discussion with the patient and the patient's niece by the name of Dianne with telephone number [phone] and she was agreeable to the patient going for rehab, however, she preferred the patient to go [facility] or [facility].  So far, the patient has remained clinically stable.  Dilantin level is normalized. His gait is still slightly antalgic.  He was evaluated by me this a.m., which is November 14, 2010, denied any specific complaint.  Examination was essentially unremarkable.  Vital Signs, blood pressure is 122/73, pulse 16, respiratory rate 16, temperature is 98.0, medically stable.  Plan is for the patient to be discharged to rehab as soon as bed is made available and activity as tolerated, PT/OT, and he will be on cardiac prudent diet.  It is important to mention the patient was evaluated by the in-house Neurology, Dr. Thad Ranger, who recommended that the patient's Dilantin level should be held and he will be restarted on the much lower dose until it was normalized.  MEDICATION TO BE TAKEN AT THE FACILITY: 1. Hydrocortisone cream 1% applied topically b.i.d. to affected     lesion. 2. Phenytoin 100 mg (Dilantin) one p.o. b.i.d. 3. Aleve (naproxen sodium) 220 mg two tablets p.o. q.12 p.r.n. 4. Calcium carbonate/vitamin D over-the-counter one p.o. b.i.d. 5. Dipyridamole 25 mg one p.o. b.i.d. 6. Folic acid 1 mg one p.o. daily. 7. Hydrochlorothiazide 25 mg one p.o. daily. 8. Lisinopril 40 mg one p.o. daily. 9. Simvastatin 20 mg one p.o. daily at  bedtime.     Talmage Nap, MD     CN/MEDQ  D:  11/13/2010  T:  11/13/2010  Job:  [id number]  cc:   Rehab Facility Dr. Clovis Riley  Electronically Signed by Talmage Nap  on 11/13/2010 04:35:22 PM

## 2010-11-14 LAB — COMPREHENSIVE METABOLIC PANEL
ALT: 14 U/L (ref 0–53)
Albumin: 3.4 g/dL — ABNORMAL LOW (ref 3.5–5.2)
Calcium: 10 mg/dL (ref 8.4–10.5)
GFR calc Af Amer: >60 mL/min (ref >60)
Glucose, Bld: 96 mg/dL (ref 70–99)
Potassium: 3.3 mEq/L — ABNORMAL LOW (ref 3.5–5.1)
Sodium: 138 mEq/L (ref 135–145)
Total Protein: 6.7 g/dL (ref 6.0–8.3)

## 2010-11-14 LAB — DIFFERENTIAL
Basophils Absolute: 0.1 10*3/uL (ref 0.0–0.1)
Basophils Relative: 1 % (ref 0–1)
Eosinophils Absolute: 0.3 10*3/uL (ref 0.0–0.7)
Eosinophils Relative: 4 % (ref 0–5)
Monocytes Absolute: 1.2 10*3/uL — ABNORMAL HIGH (ref 0.1–1.0)
Monocytes Relative: 14 % — ABNORMAL HIGH (ref 3–12)

## 2010-11-14 LAB — CBC
Hemoglobin: 13.4 g/dL (ref 13.0–17.0)
MCH: 33.5 pg (ref 26.0–34.0)
MCHC: 34.8 g/dL (ref 30.0–36.0)
Platelets: 264 10*3/uL (ref 150–400)
RDW: 12.6 % (ref 11.5–15.5)

## 2010-11-14 NOTE — Discharge Summary (Signed)
  NAMEKEENEN, [name]:  [id number]  MEDICAL RECORD NO.:  [id number]  LOCATION:  3009                         FACILITY:  [facility]  PHYSICIAN:  Talmage Nap, MD  DATE OF BIRTH:  [birth year]  DATE OF ADMISSION:  11/08/2010 DATE OF DISCHARGE:  11/14/2010                        DISCHARGE SUMMARY - REFERRING   ADDENDUM  Please for the discharge summary as well as discharge diagnoses and medications, refer to my discharge summary dictated on November 13, 2010. The patient was not able to go to rehab on November 05, 2010, because bed was not made available.  However, I was informed that bed has been made available for the patient today.  The patient was, however, seen by me today.  He denied any complaint.  Examination of the patient was essentially unremarkable.  His vital signs; blood pressure is 120/68, pulse 76, respiratory rate 20, temperature is 97.4.  Available labs for November 14, 2010, include magnesium level 1.8. Comprehensive metabolic panel showed sodium of 138, potassium of 3.3, chloride of 100 with a bicarb of 28, glucose is 96, BUN is 29, creatinine is 1.11.  Complete blood count with differential showed WBC of 8.6, hemoglobin 13.4, hematocrit 38.5, MCV of 96.3 with a platelet count of 264.  The patient is medically stable.  Plan is for the patient to be discharged to rehab today.  Additional medications to be added will include potassium chloride 10 mEq p.o. b.i.d.  Other meds are just as dictated in my summary of November 13, 2010.     Talmage Nap, MD     CN/MEDQ  D:  11/14/2010  T:  11/14/2010  Job:  [id number]  Electronically Signed by Talmage Nap  on 11/14/2010 06:46:26 PM

## 2010-11-15 NOTE — Discharge Summary (Signed)
  NAMEULYS, [name] NO.:  [id number]  MEDICAL RECORD NO.:  [id number]  LOCATION:  3009                         FACILITY:  [facility]  PHYSICIAN:  Talmage Nap, MD  DATE OF BIRTH:  11-18-1924  DATE OF ADMISSION:  11/08/2010 DATE OF DISCHARGE:  11/15/2010                        DISCHARGE SUMMARY - REFERRING   ADDENDUM:  For 2 days I have been having extreme difficulty trying to get the patient to rehab, however, the patient was evaluated by me today which is November 15, 2010, remained clinically stable.  Examination of the patient was essentially unremarkable.  Vital Signs:  Blood pressure is 120/76, temperature 97.6 pulse is 88, respiratory rate is 24, medically stable.  Plan is for the patient to be discharged to rehab as soon as bed is made available.  Please for discharge instructions and meds refer to my discharge summary dictated on November 05, 2010.     Talmage Nap, MD     CN/MEDQ  D:  11/15/2010  T:  11/15/2010  Job:  [id number]  Electronically Signed by Talmage Nap  on 11/15/2010 04:08:36 PM

## 2010-11-16 NOTE — H&P (Signed)
[name] Holmes, [name] NO.:  [id number]  MEDICAL RECORD NO.:  [id number]  LOCATION:  [facility]                         FACILITY:  [facility]  PHYSICIAN:  Mauro Kaufmann, MD         DATE OF BIRTH:  08-06-1924  DATE OF ADMISSION:  11/08/2010 DATE OF DISCHARGE:                             HISTORY & PHYSICAL   PRIMARY CARE PHYSICIAN:  Dr. Clovis Riley.  CHIEF COMPLAINT:  Status post fall.  HISTORY OF PRESENT ILLNESS:  An 77 year old male who has a history of seizure disorder, also history of CVA.  In the past was brought to the hospital after the patient had an episode of unsteadiness and lowering himself to the ground and falling but did not the head.  CT of the head was negative in the hospital for any bleed.  The patient has a history of seizures, has been taking Dilantin twice a day 100 mg for the last 18 years, had not had any seizures.  Recently the dose of Dilantin was increased from 2 tablets a day to 3 tablets a day at the neurologist's office by Dr. Sharene Skeans as of September 24, 2010.  The patient's level at that time was 5.3 and the repeat levels on October 08, 2010 has come to 13.4 and the patient says that he has been feeling more ataxic and has been falling a lot at home.  He also has bruises, especially in the arms and legs and also those on the forehead which are old.  The patient says that he has become more unsteady and also it was noted per the caregiver this morning that the patient had a right-sided facial droop, so the patient was brought to the hospital for further evaluation for possible stroke.  The patient denies any chest pain, denies any shortness of breath.  Did not have any seizures.  Admits to having some blurred vision.  PAST MEDICAL HISTORY:  Significant for: 1. Hypertension. 2. Diarrhea. 3. Hyperlipidemia. 4. Prostate cancer. 5. He had a seizure disorder.  SURGICAL HISTORY: 1. Appendectomy. 2. Cataract surgery. 3. Tonsillectomy.  SOCIAL HISTORY:   The patient is nonsmoker, nonalcoholic.  No history of illicit drug abuse.  ALLERGIES:  None.  CURRENT MEDICATIONS: 1. Calcium 600 mg 2 tablets daily. 2. Dilantin 100 mg 3 tablets daily at bedtime. 3. Dipyridamole 25 p.o. b.i.d. 4. Folic acid 1 mg p.o. daily. 5. Hydrochlorothiazide 25 mg p.o. daily. 6. Lisinopril 40 mg p.o. daily. 7. Simvastatin 20 mg p.o. daily.  REVIEW OF SYSTEMS:  There is no headache.  There is positive blurred vision.  No runny nose or sore throat.  NECK:  No history of thyroid problems.  CHEST:  No history of COPD, emphysema.  HEART:  No history of CAD.  GI: No nausea, vomiting, or diarrhea.  GENITOURINARY:  Positive history of hesitancy, but denies any dysuria, urgency or frequency of urination.  NEURO:  Positive history of seizures in the past.  No recent seizure activity.  PHYSICAL EXAMINATION:  VITAL SIGNS:  The patient's blood pressure 121/73, temperature 98.1, pulse 77, respiration is 17. HEENT:  Head is atraumatic, normocephalic. Eyes, extraocular muscles are intact.  Oral mucosa is  pink and moist. NECK:  Supple. CHEST:  Clear to auscultation bilaterally. HEART:  S1, S2.  Regular rate and rhythm. ABDOMEN:  Soft, nontender.  No organomegaly. EXTREMITIES:  No cyanosis, no clubbing, no edema, but there are various bruises noted on the extremities which are in various stages of healing. NEUROLOGIC:  Cranial nerves II through XII grossly intact.  Motor strength is 5/5 in both upper and lower extremities.  Mild reduction of the strength in the right lower extremity, but there is no other focal deficit noted at this time.  PERTINENT IMAGING STUDIES DONE IN THE HOSPITAL:  CAT scan of the head was done which showed negative for any intracranial hemorrhage, cerebral atrophy, microvascular change, or ventricular enlargement.  PERTINENT LABORATORY DATA:  INR 1.02, troponin less than 0.30, WBC 6.8, hemoglobin 14.4, hematocrit 13.2, platelet count of 253.   Glucose 148, lipase 27.  Sodium 138, potassium 3.5, chloride is 100, CO2 of 27, BUN 14, creatinine 1.13.  Glucose is 147.  EKG shows sinus rhythm, right bundle-branch block and left anterior vascular block.  ASSESSMENT: 1. Ataxia 2. Left facial droop, questionable transient ischemic attack. 3. Hypertension. 4. Seizure disorder, currently on Dilantin.  PLAN: 1. Ataxia.  The patient has ataxia which has developed after the     Dilantin dose was increased.  At this time, we are going to repeat     the Dilantin levels and Neurology consultation has been obtained.     Maybe the patient needs to go back to twice a day Dilantin dose as     he has manifested the symptoms of possible toxicity at this time.     We will let Neurology see the patient and make the decision.  In     the meantime, I am going to cut down the dose of Dilantin to 100 mg     p.o. b.i.d. 2. Left facial droop.  The patient does not have any facial droop at     this time and no neurologic deficit but we are going to obtain an     MRI of the brain to make sure there is no underlying CVA.  Rest of     the workup as per neurology recommendation. 3. Hypertension.  The patient will be put on the hydrochlorothiazide     which he has been taking along with the lisinopril. 4. Seizure disorder.  Currently, the patient will be on Dilantin. 5. DVT prophylaxis with Lovenox. 6. BPH.  Also to obtain a UA and culture and sensitivity as the     patient has complained of hesitancy.  He is not on any medication     for BPH.  The patient will follow up with Urology as outpatient by     Dr. Patsi Sears for further recommendations.  The patient already     has an appointment as outpatient.     Mauro Kaufmann, MD     GL/MEDQ  D:  11/08/2010  T:  11/08/2010  Job:  [id number]  Electronically Signed by Mauro Kaufmann  on 11/16/2010 12:07:00 PM

## 2010-11-25 NOTE — Consult Note (Signed)
NAMESHEDDRICK, LATTANZIO NO.:  [id number]  MEDICAL RECORD NO.:  [id number]  LOCATION:  3009                         FACILITY:  [facility]  PHYSICIAN:  Dr. Thad Ranger           DATE OF BIRTH:  July 04, 1924  DATE OF CONSULTATION:  11/08/2010 DATE OF DISCHARGE:                                CONSULTATION   REASON FOR CONSULTATION:  Supratherapeutic level of Dilantin with dizziness and falls.  HISTORY OF PRESENT ILLNESS:  This is a pleasant 77 year old male who has known seizure disorder and has been on Dilantin for a long period of time.  Majority of the history is obtained by caregiver who is at bedside.  Per caregiver, the patient has been on 200 mg nightly of Dilantin for at least 10 to 8 years.  The patient has been seizure-free without any difficulties during this time on this level.  On June 7, the patient saw the nurse practitioner at [facility] for a Dilantin level check.  At that time Dilantin level was shown to be subtherapeutic at 5.3.  Due to this reason, the patient's Dilantin dose was increased to 300 mg nightly starting on June 11.  The patient went back to [facility] on June 21 to get a Dilantin level which showed to be 13.4.  Per the patient's caregiver starting on July 9, she noted the patient having significant unsteadiness and frequent falls.  Today on July 23, one of the patient's caregivers noted that the patient had slurred speech, questionable left facial droop and was very much unsteady on his feet and for this reason the patient was brought to the emergency department.  In the emergency department, the patient's Dilantin level was found to be 27.5.  Of note; I do not know what the patient's baseline Dilantin usually is.  PAST MEDICAL HISTORY:  Prostate cancer, asthma, seizure disorder, stroke in 2005, chronic gait unsteadiness secondary to stroke, history of falls.  MEDICATIONS:  The patient is on Aleve,  folic acid, lisinopril, hydrochlorothiazide, simvastatin, Dipyridamole and newly elevated Dilantin of 300 mg nightly.  ALLERGIES:  No known drug allergies.  SOCIAL HISTORY:  The patient lives alone, but he does have power of attorney and a caregiver who frequently follows up with him.  REVIEW OF SYSTEMS:  Negative with exception above.  On physical exam; temperature is 98.8, blood pressure 121/74, heart rate 72, respirations 18.  Lungs are clear to auscultation.  Neck, negative bruits.  Cardiovascular, S1 and S2 is audible.  Abdomen is soft, nontender, nondistended.  Skin is warm, dry and intact.  Neurological exam, pupils are equal, round, and reactive to light and accommodating approximately 3 mm to 2 mm.  Extraocular movements are intact.  The patient has a positive left nasolabial fold decreased, however, his stroke in the past did leave him with left facial weakness. The patient's speech appears slurred at this time, however caregiver states this is his normal speech.  His tongue is midline.  Facial sensation is grossly intact.  The patient shows no drift in the upper extremities.  No drift in the lower extremities.  His upper extremities show 5/5 strength.  Lower extremities show 5/5 strength.  The patient's finger-to-nose and heel-to-shin appear smooth with no dysmetria.  The patient's deep tendon reflexes are 1+ with upgoing toes bilaterally.  Sodium 138, potassium 3.5, chloride 100, CO2 27, BUN 14, creatinine is 1.13, glucose 147, white blood cell count is 6.8, platelets 253, hemoglobin is 14.4, hematocrit 39.2, Dilantin level is 27.5.  CT of brain is negative for any intracranial bleed, mass or hemorrhage. No acute strokes noted.  ASSESSMENT:  This is an 77 year old male who was recently had his Dilantin dose increased secondary to a lab level that was subtherapeutic.  Over a period of 3-4 weeks, his gait and balance progressively became unstable and he has had  multiple falls.  In the emergency department, his Dilantin level was measured at 27.5.  Most likely cause of the patient's dizziness and falls is supratherapeutic Dilantin level.  We would recommend at this time; 1. Holding his Dilantin dose today with a Dilantin level in the     morning.  Once the Dilantin level is therapeutic, may restart the patient on 200 mg     nightly.   2. The patient needs to follow up with     [facility] after discharge.  I spent greater than 45-50 minutes in the room with the patient and his caregiver explaining in depth the possibilities of how his Dilantin level could be supratherapeutic and signs and symptoms to look for.  Dr. Thad Ranger has seen and evaluated the patient, agrees the above mentioned.     Felicie Morn, PA-C   ______________________________ Dr. Thad Ranger    DS/MEDQ  D:  11/08/2010  T:  11/09/2010  Job:  [id number]  cc:   Dr. Thad Ranger  Electronically Signed by Felicie Morn PA-C on 11/09/2010 11:12:25 AM Electronically Signed by Thana Farr MD on 11/25/2010 09:59:15 AM

## 2011-01-27 LAB — CBC
HCT: 42.9
Hemoglobin: 14.9
MCHC: 34.7
RBC: 4.26

## 2011-01-27 LAB — URINALYSIS, ROUTINE W REFLEX MICROSCOPIC
Bilirubin Urine: NEGATIVE
Nitrite: NEGATIVE
Specific Gravity, Urine: 1.014
Urobilinogen, UA: 0.2
pH: 6

## 2011-01-27 LAB — DIFFERENTIAL
Basophils Absolute: 0
Basophils Relative: 0
Eosinophils Absolute: 0.1
Neutrophils Relative %: 81 — ABNORMAL HIGH

## 2011-01-27 LAB — COMPREHENSIVE METABOLIC PANEL
ALT: 23
Alkaline Phosphatase: 106
BUN: 18
CO2: 30
GFR calc non Af Amer: 54 — ABNORMAL LOW
Glucose, Bld: 112 — ABNORMAL HIGH
Potassium: 3.4 — ABNORMAL LOW
Sodium: 139

## 2011-01-27 LAB — PHENYTOIN LEVEL, TOTAL: Phenytoin Lvl: 6.3 — ABNORMAL LOW

## 2011-02-25 ENCOUNTER — Encounter (HOSPITAL_COMMUNITY): Payer: Self-pay | Admitting: *Deleted

## 2011-02-25 ENCOUNTER — Emergency Department (HOSPITAL_COMMUNITY)
Admission: EM | Admit: 2011-02-25 | Discharge: 2011-02-26 | Disposition: A | Discharge status: Home / Self Care | Payer: Medicare Other | Attending: Emergency Medicine | Admitting: Emergency Medicine

## 2011-02-25 ENCOUNTER — Emergency Department (HOSPITAL_COMMUNITY): Payer: Medicare Other

## 2011-02-25 DIAGNOSIS — I10 Essential (primary) hypertension: Secondary | ICD-10-CM | POA: Insufficient documentation

## 2011-02-25 DIAGNOSIS — R531 Weakness: Secondary | ICD-10-CM

## 2011-02-25 DIAGNOSIS — R5381 Other malaise: Secondary | ICD-10-CM | POA: Insufficient documentation

## 2011-02-25 DIAGNOSIS — R5383 Other fatigue: Secondary | ICD-10-CM | POA: Insufficient documentation

## 2011-02-25 DIAGNOSIS — G319 Degenerative disease of nervous system, unspecified: Secondary | ICD-10-CM | POA: Insufficient documentation

## 2011-02-25 DIAGNOSIS — Z8679 Personal history of other diseases of the circulatory system: Secondary | ICD-10-CM | POA: Insufficient documentation

## 2011-02-25 DIAGNOSIS — E785 Hyperlipidemia, unspecified: Secondary | ICD-10-CM | POA: Insufficient documentation

## 2011-02-25 DIAGNOSIS — R569 Unspecified convulsions: Secondary | ICD-10-CM | POA: Insufficient documentation

## 2011-02-25 DIAGNOSIS — R42 Dizziness and giddiness: Secondary | ICD-10-CM | POA: Insufficient documentation

## 2011-02-25 DIAGNOSIS — R4182 Altered mental status, unspecified: Secondary | ICD-10-CM | POA: Insufficient documentation

## 2011-02-25 HISTORY — DX: Hyperlipidemia, unspecified: E78.5

## 2011-02-25 HISTORY — DX: Unspecified convulsions: R56.9

## 2011-02-25 HISTORY — DX: Essential (primary) hypertension: I10

## 2011-02-25 LAB — CBC
HCT: 42.8 % (ref 39.0–52.0)
Hemoglobin: 15.1 g/dL (ref 13.0–17.0)
MCH: 34.4 pg — ABNORMAL HIGH (ref 26.0–34.0)
MCHC: 35.3 g/dL (ref 30.0–36.0)
MCV: 97.5 fL (ref 78.0–100.0)
Platelets: 390 10*3/uL (ref 150–400)
RBC: 4.39 MIL/uL (ref 4.22–5.81)
RDW: 12.4 % (ref 11.5–15.5)
WBC: 12.4 10*3/uL — ABNORMAL HIGH (ref 4.0–10.5)

## 2011-02-25 LAB — POCT I-STAT, CHEM 8
BUN: 29 mg/dL — ABNORMAL HIGH (ref 6–23)
Calcium, Ion: 1.25 mmol/L (ref 1.12–1.32)
Chloride: 104 mEq/L (ref 96–112)
HCT: 45 % (ref 39.0–52.0)
Sodium: 140 mEq/L (ref 135–145)
TCO2: 27 mmol/L (ref 0–100)

## 2011-02-25 LAB — PHENYTOIN LEVEL, TOTAL: Phenytoin Lvl: 9.7 ug/mL — ABNORMAL LOW (ref 10.0–20.0)

## 2011-02-25 NOTE — ED Notes (Signed)
Family reports that last Monday pt had weakness, pt normally walks with walker. Pt lives at home. This Monday pt had weakness again and had to crawl out of bathroom. Had dilantin level drawn and it was normal but family reports that pt is not eating normally. Pt with hx of stroke. But pt is alert and oriented x 3. Grips are equal. No deficits noted. Family states neuro told them to come here for a MRI.

## 2011-02-26 LAB — URINALYSIS, ROUTINE W REFLEX MICROSCOPIC
Bilirubin Urine: NEGATIVE
Glucose, UA: NEGATIVE mg/dL
Hgb urine dipstick: NEGATIVE
Leukocytes, UA: NEGATIVE
Specific Gravity, Urine: 1.017 (ref 1.005–1.030)

## 2011-02-26 NOTE — ED Notes (Signed)
Pt d/c home with niece and voiced understanding to f/u with PCP on Monday.  No new rx given.

## 2011-02-26 NOTE — ED Provider Notes (Cosign Needed)
History     CSN: [id number] Arrival date & time: 02/25/2011  4:49 PM   First MD Initiated Contact with Patient 02/25/11 2034      Chief Complaint  Patient presents with  . Weakness    (Consider location/radiation/quality/duration/timing/severity/associated sxs/prior treatment) Patient is a 77 y.o. male presenting with weakness. The history is provided by the patient and a caregiver.  Weakness The primary symptoms include altered mental status and dizziness. Primary symptoms do not include headaches, syncope, loss of consciousness, focal weakness, fever, nausea or vomiting. Primary symptoms comment: He has anorexia. He occasionally has trouble standing and walking and occasionally uses his walker.  Dizziness also occurs with weakness. Dizziness does not occur with nausea or vomiting.  Additional symptoms include weakness.   The patient was admitted several months ago with Dilantin toxicity. He had an inpatient rehabilitation stay following hospital discharge. He has been at home for several months now with help from his nieces, one of which is his power of attorney. They feel like he is weaker than usual having trouble remembering to take his medicines and occasionally does not eat. It is unclear if he is not hungry or just forgetting to eat. The family is very concerned that he might be toxic on Dilantin, again. They contacted his neurologist, who recommended that he come to the emergency department and get an MRI of the brain. The patient has had 3 weeks of generalized decreased activity and no acute changes. Today. He has been living alone and functioning reasonably well. He came here by private vehicle.  Past Medical History  Diagnosis Date  . Stroke   . Seizures   . Hypertension   . Hyperlipidemia     History reviewed. No pertinent past surgical history.  History reviewed. No pertinent family history.  History  Substance Use Topics  . Smoking status: Never Smoker   . Smokeless  tobacco: Never Used  . Alcohol Use: No      Review of Systems  Constitutional: Negative for fever.  Cardiovascular: Negative for syncope.  Gastrointestinal: Negative for nausea and vomiting.  Neurological: Positive for dizziness and weakness. Negative for focal weakness, loss of consciousness and headaches.  Psychiatric/Behavioral: Positive for altered mental status.    Allergies  Actonel  Home Medications   Current Outpatient Rx  Name Route Sig Dispense Refill  . CALCIUM-VITAMIN D 600-200 MG-UNIT PO TABS Oral Take 1 tablet by mouth 2 (two) times daily.      Marland Kitchen DIPYRIDAMOLE 25 MG PO TABS Oral Take 25 mg by mouth 2 (two) times daily.      Marland Kitchen FOLIC ACID 1 MG PO TABS Oral Take 1 mg by mouth every morning.      Marland Kitchen HYDROCHLOROTHIAZIDE 25 MG PO TABS Oral Take 25 mg by mouth daily.      Marland Kitchen LISINOPRIL 40 MG PO TABS Oral Take 40 mg by mouth daily.      Marland Kitchen PHENYTOIN SODIUM EXTENDED 100 MG PO CAPS Oral Take 100 mg by mouth 2 (two) times daily.      Marland Kitchen POTASSIUM CHLORIDE 10 MEQ PO TBCR Oral Take 10 mEq by mouth 2 (two) times daily.      Marland Kitchen SIMVASTATIN 20 MG PO TABS Oral Take 20 mg by mouth at bedtime.        BP 150/82  Pulse 83  Temp(Src) 98 F (36.7 C) (Oral)  Resp 18  SpO2 94%  Physical Exam  ED Course  Procedures (including critical care time) The emergency department  social worker saw the patient to help the family and the patient arrange outpatient assistance. Labs Reviewed  CBC - Abnormal; Notable for the following:    WBC 12.4 (*)    MCH 34.4 (*)    All other components within normal limits  PHENYTOIN LEVEL, TOTAL - Abnormal; Notable for the following:    Phenytoin Lvl 9.7 (*)    All other components within normal limits  POCT I-STAT, CHEM 8 - Abnormal; Notable for the following:    BUN 29 (*)    All other components within normal limits  URINALYSIS, ROUTINE W REFLEX MICROSCOPIC  I-STAT, CHEM 8  URINE CULTURE   Dg Chest 2 View  02/25/2011  *RADIOLOGY REPORT*  Clinical  Data: Hypertension.  CHEST - 2 VIEW  Comparison: Chest x-ray 07/23 1012.  Findings: The cardiac silhouette, mediastinal and hilar contours are stable.  The lungs are clear acute process.  Chronic interstitial changes noted at both lung bases.  No pleural effusion.  The bony thorax is intact.  IMPRESSION:  1.  Persistent bibasilar interstitial disease. 2.  No acute pulmonary findings.  Original Report Authenticated By: P. Loralie Champagne, M.D.   Ct Head Wo Contrast  02/25/2011  *RADIOLOGY REPORT*  Clinical Data: Weakness.  CT HEAD WITHOUT CONTRAST  Technique:  Contiguous axial images were obtained from the base of the skull through the vertex without contrast.  Comparison: Head CT 11/08/2010.  Findings: Stable age related cerebral atrophy, marked ventriculomegaly and periventricular white matter disease.  No extra-axial fluid collections are identified.  No CT findings for acute hemispheric infarction or intracranial hemorrhage.  No mass lesions.  The brainstem and cerebellum are grossly normal and stable.  The calvarium is intact.  The paranasal sinuses and mastoid air cells are clear.  Chronic sclerotic changes involving the lower aspect of the left mastoid air cells.  IMPRESSION:  1.  Stable age related cerebral atrophy, ventriculomegaly and periventricular white matter disease. 2.  No acute intracranial findings or mass lesions.  Original Report Authenticated By: P. Loralie Champagne, M.D.     1. Weakness       MDM  Gen. malaise, and chronic debilitation, without apparent acute infectious metabolic, or central nervous system abnormality. I doubt Dilantin toxicity as he has no ataxia. The patient is stable for discharge with outpatient management. He will need in home health services to assess her ability to manage himself in his home environment and close followup with his primary care provider        Flint Melter, MD 02/26/11 [phone]

## 2011-02-26 NOTE — ED Notes (Signed)
Family reports pt has been weak and forgetful at home, increasingly over the past week.

## 2011-02-27 LAB — URINE CULTURE

## 2012-08-07 ENCOUNTER — Other Ambulatory Visit: Payer: Self-pay | Admitting: Neurology

## 2012-09-04 ENCOUNTER — Ambulatory Visit: Payer: Self-pay | Admitting: Neurology

## 2012-09-04 ENCOUNTER — Other Ambulatory Visit: Payer: Self-pay | Admitting: Neurology

## 2012-09-25 ENCOUNTER — Other Ambulatory Visit: Payer: Self-pay | Admitting: Neurology

## 2012-10-02 ENCOUNTER — Other Ambulatory Visit: Payer: Self-pay | Admitting: Neurology

## 2012-10-04 ENCOUNTER — Encounter: Payer: Self-pay | Admitting: Neurology

## 2012-10-04 ENCOUNTER — Ambulatory Visit (INDEPENDENT_AMBULATORY_CARE_PROVIDER_SITE_OTHER): Payer: Medicare Other | Admitting: Neurology

## 2012-10-04 VITALS — BP 155/64 | HR 52 | Resp 14 | Wt 154.0 lb

## 2012-10-04 DIAGNOSIS — T50904A Poisoning by unspecified drugs, medicaments and biological substances, undetermined, initial encounter: Secondary | ICD-10-CM

## 2012-10-04 DIAGNOSIS — M810 Age-related osteoporosis without current pathological fracture: Secondary | ICD-10-CM

## 2012-10-04 DIAGNOSIS — R569 Unspecified convulsions: Secondary | ICD-10-CM

## 2012-10-04 DIAGNOSIS — G620 Drug-induced polyneuropathy: Secondary | ICD-10-CM

## 2012-10-04 HISTORY — DX: Drug-induced polyneuropathy: G62.0

## 2012-10-04 HISTORY — DX: Unspecified convulsions: R56.9

## 2012-10-04 MED ORDER — PHENYTOIN SODIUM EXTENDED 100 MG PO CAPS: 100.0000 mg | ORAL_CAPSULE | Freq: Two times a day (BID) | ORAL | Status: DC

## 2012-10-04 NOTE — Progress Notes (Signed)
[facility]  Provider:  Dr Latania Bascomb Referring Provider:  Marjory Sneddon, MD  Primary Care Physician:  Rayford Halsted, PT  Chief Complaint  Patient presents with  . Neurologic Problem    RM#10    HPI:  [name] Holmes is a 79 y.o. male here as a referral from Dr. Clovis Riley, in follow up.   [name] Holmes is a mean white 79 year old gentleman who has been followed in this office for many years originally by Dr. Sharene Skeans for seizure disorder. His last seizure documented in 1994 he also has a history of a right-sided stroke and takes no longer Aggrenox after he was evaluated by Dr . 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  The patient is currently taking 100 mg of Dilantin twice a day, twice a day potassium supplements twice a day calcium and vitamin D supplements once a day folic acids once a day amlodipine at 5 mg and once a day in p.m. simvastatin generic Zocor 20 mg. He is also taking Persantine would like for him at all 25 mg twice a day which replaced the brand Aggrenox.   I have asked as to Dr. Kathrene Bongo snorts from October 2013 and equal 2014, Dr. August Saucer Mitchell's office notes and the laboratory values from September 2013. His last phenytoin level from January was 9.3 But in his age and for his well-controlled seizure disorder should not be adjusted. The patient mainly has problems with standing and walking steadily he is using a walker now. He also states that he feels generalized a little weaker. In early 2012 the patient was admitted to the hospital with a Dilantin toxicity and following his hospital discharge was in inpatient rehabilitation. He had nor hospitalizations since. Today as a routine revisit to refill her seizure medications and evaluate the effects of Dilantin on  his CNS.  His prescriptions go to [address] [place] , [facility] for 90 day supplies.     Review of Systems: Out of a complete 14 system review, the patient complains of only the following symptoms, and all other reviewed systems are negative.  The patient endorsed today that he has a tendency to easily bruise and bleed , had increased thirst,  joint swelling and a history of confusional spells as well as progressive  Mild memory loss.   History   Social History  . Marital Status: Widowed    Spouse Name: N/A    Number of Children: N/A  . Years of Education: N/A   Occupational History  . retired     former [employer] employee   Social History Main Topics  . Smoking status: Never Smoker   . Smokeless tobacco: Never Used  . Alcohol Use: No  . Drug Use: Not on file  . Sexually Active: Not on file   Other Topics Concern  . Not on file   Social History Narrative  . No narrative on file    History reviewed. No pertinent family history.  Past Medical History  Diagnosis Date  . Stroke   . Seizures   . Hypertension   . Hyperlipidemia   . Prostate cancer     radiation    Past Surgical History  Procedure Laterality Date  . Cva  2006  . Appendectomy  1939    Current Outpatient Prescriptions  Medication Sig Dispense Refill  . amLODipine (NORVASC) 5 MG tablet Take 5 mg by mouth daily.      . Calcium-Vitamin D 600-200 MG-UNIT per tablet Take 1 tablet by mouth 2 (two) times daily.        Marland Kitchen dipyridamole (PERSANTINE) 25 MG tablet Take 25 mg by mouth 2 (two) times daily.        . folic acid (FOLVITE) 1 MG tablet Take 1 mg by mouth every morning.        . hydrochlorothiazide (HYDRODIURIL) 25 MG tablet Take 25 mg by mouth daily.        . phenytoin (DILANTIN) 100 MG ER capsule take 1 capsule by mouth twice a day  60 capsule  0  . potassium chloride (KLOR-CON) 10 MEQ CR tablet Take 10 mEq by mouth 2 (two) times daily.        . simvastatin (ZOCOR) 20 MG tablet Take 20  mg by mouth at bedtime.        . dipyridamole-aspirin (AGGRENOX) 200-25 MG per 12 hr capsule Take 1 capsule by mouth 2 (two) times daily.      Marland Kitchen lisinopril (PRINIVIL,ZESTRIL) 40 MG tablet Take 40 mg by mouth daily.         No current facility-administered medications for this visit.    Allergies as of 10/04/2012 - Review Complete 10/04/2012  Allergen Reaction Noted  . Penicillins  10/04/2012  . Pollen extract  10/04/2012  . Risedronate sodium Other (See Comments) 02/25/2011    Vitals: BP 155/64  Pulse 52  Resp 14  Wt 154 lb (69.854 kg)  BMI 24.87 kg/m2 Last Weight:  Wt Readings from Last 1 Encounters:  10/04/12 154 lb (69.854 kg)   Last Height:   Ht Readings from Last 1 Encounters:  10/04/12 5\' 6"  (1.676 m)    Physical exam:  General: The patient is awake, alert and appears not in acute distress. The patient is well groomed. Head: Normocephalic, atraumatic. Neck is supple.  Cardiovascular:  Regular rate and rhythm, without  murmurs or carotid bruit, and without distended neck veins. Respiratory: Lungs are clear to auscultation. Skin:  Without evidence of edema, or rash bruising from a fall last Saturday , left elbow . Trunk: BMI normal , patient has a scoliosis related change in posture.  Neurologic exam : The patient is awake and alert, oriented to place and time.  Memory subjective  described as mild impaired  " age related ".  There is a normal attention span & concentration ability. Speech is fluent with  dysphonia or aphasia.  Mood and affect are appropriate.  Cranial nerves: Pupils are equal and briskly reactive to light. The patient rests with his mouth open,  Right eye esotropia- Extraocular movements intact but not conjugate ,  without nystagmus. Visual fields by finger perimetry are intact. Hearing to finger rub intact.  Facial sensation intact to fine touch. Facial motor strength, left lower facial droop,  tongue and uvula move midline.  Motor exam:  Normal   Grip  Strength on the right,  Left hand is not able to extend , contracture since  He was 79 years of age. Otherwise   tone and  muscle bulk normal, and symmetricl strength in both legs.   Sensory:  Fine touch, pinprick and vibration were  Decreased in both feet and mid calf level. Proprioception is tested in the upper extremities was  normal.  Coordination: Rapid alternating movements in the fingers/handswere deferred due to the disability in  his left hand, and the  Rapid movements were not possible in the righ ( contracture) t.  Finger-to-nose maneuver tested and normal without evidence of ataxia, dysmetria or tremor on the right .  Gait and station: Patient walks  Slowly with assistive device and is leaning heavily on his walker. Deep tendon reflexes: in the  upper and lower extremities are  attenuated.     Assessment:  After physical and neurologic examination, review of laboratory studies, imaging, neurophysiology testing and pre-existing records. The long time patient of [facility] presents today for a routine revisit, he still continues to take Dilantin twice daily and I discussed with them that after 20 years of seizure-free time, it will be possible to initiate a slow weaning process. I explained the long-term effects of Dilantin on his body which has already taken place about not likely to revert.  The patient decided he would rather continue taking the medication which is a valid choice. He is using his walker but still had falls inside his home, where his walker cannot be used.  He has a daily aid, he has also person that cooks and prepares food for the whole week ahead of time. His niece coordinated healthcare but does not live in the same place.    Plan:  Treatment plan and additional workup will be reviewed under Problem List.  I refill the Dilantin will order a Dilantin level for this patient today free and bound. Reviewing Dr. Jon Gills and Mitchell's   laboratory results I do not think that we need to repeat a metabolic panel

## 2012-10-04 NOTE — Patient Instructions (Signed)
Phenytoin capsules and extended-release capsules What is this medicine? PHENYTOIN (FEN i toyn) is used to control seizures in certain types of epilepsy. It is also used to prevent seizures during or after surgery. This medicine may be used for other purposes; ask your health care provider or pharmacist if you have questions. What should I tell my health care provider before I take this medicine? They need to know if you have any of these conditions: -an alcohol abuse problem -Asian ancestry -blood disorders or disease -diabetes -heart problems -kidney disease -liver disease -porphyria -receiving radiation therapy -suicidal thoughts, plans, or attempt; a previous suicide attempt by you or a family member -thyroid disease -an unusual or allergic reaction to phenytoin, other medicines, foods, dyes, or preservatives -pregnant or trying to get pregnant -breast-feeding How should I use this medicine? Take this medicine by mouth with a glass of water. Follow the directions on the prescription label. If you are taking extended-release capsules, swallow them whole. Do not crush or chew. Take this medicine with food if it upsets your stomach. It may be best to take your medicine consistently with or without food. Take your doses at regular intervals. Do not take your medicine more often than directed. Do not stop taking this medicine suddenly. This increases the risk of seizures. Your doctor will tell you how much medicine to take. If your doctor wants you to stop the medicine, the dose will be slowly lowered over time to avoid any side effects. Talk to your pediatrician regarding the use of this medicine in children. Special care may be needed. Overdosage: If you think you have taken too much of this medicine contact a poison control center or emergency room at once. NOTE: This medicine is only for you. Do not share this medicine with others. What if I miss a dose? If you miss a dose, take it as  soon as you can. If it is almost time for your next dose, take only that dose. Do not take double or extra doses. What may interact with this medicine? Do not take this medicine with any of the following medications: -delavirdine This medicine may also interact with the following medications: -alcohol -aspirin and aspirin-like medicines -barbiturate medicines for inducing sleep or treating seizures -calcium supplements -carbamazepine -chloramphenicol -chlordiazepoxide -cimetidine or other medicines for heartburn or stomach ulcers -corticosteroid hormones such as prednisone or cortisone -diazepam -disulfiram -doxycycline -enteral feedings (liquid nutritional drinks or tube feeding liquids) -ethosuximide -male hormones, including contraceptive or birth control pills -furosemide -halothane -isoniazid -medicines for mental depression, anxiety or other mood problems -medicines to control heart rhythm -methsuximide -methylphenidate -molindone -phenylbutazone -reserpine -rifampin, rifabutin or rifapentine -sucralfate -sulfonamides like Azulfidine or Bactrim -theophylline -ticlopidine -tolbutamide -valproic acid -vitamin D -warfarin This list may not describe all possible interactions. Give your health care provider a list of all the medicines, herbs, non-prescription drugs, or dietary supplements you use. Also tell them if you smoke, drink alcohol, or use illegal drugs. Some items may interact with your medicine. What should I watch for while using this medicine? Visit your doctor or health care professional for regular checks on your progress. This medicine needs careful monitoring. Your doctor or health care professional may schedule regular blood tests. Wear a medical ID bracelet or chain, and carry a card that describes your disease and details of your medicine and dosage times. Do not change brands or dosage forms of this medicine without discussing the change with your  doctor or health care professional. You may  get drowsy or dizzy. Do not drive, use machinery, or do anything that needs mental alertness until you know how this medicine affects you. Do not stand or sit up quickly, especially if you are an older patient. This reduces the risk of dizzy or fainting spells. Alcohol may interfere with the effect of this medicine. Avoid alcoholic drinks. Birth control pills may not work properly while you are taking this medicine. Talk to your doctor about using an extra method of birth control. This medicine can cause unusual growth of gum tissues. Visit your dentist regularly. Problems can arise if you need dental work, and in the day to day care of your teeth. Try to avoid damage to your teeth and gums when you brush or floss your teeth. Do not take antacids at the same time as this medicine. If you get an upset stomach and want to take an antacid or medicine for diarrhea, make sure there is an interval of 2 to 3 hours before or after you took your phenytoin. The use of this medicine may increase the chance of suicidal thoughts or actions. Pay special attention to how you are responding while on this medicine. Any worsening of mood, or thoughts of suicide or dying should be reported to your health care professional right away. Women who become pregnant while using this medicine may enroll in the [place] [employer] Antiepileptic Drug Pregnancy Registry by calling [phone]. This registry collects information about the safety of antiepileptic drug use during pregnancy. What side effects may I notice from receiving this medicine? Side effects that you should report to your doctor or health care professional as soon as possible: -allergic reactions like skin rash, itching or hives, swelling of the face, lips, or tongue -breathing problems -changes in vision -chest pain or tightness -confusion -dark yellow or brown urine -fast or irregular heartbeat -fever, sore  throat -headache -loss of seizure control -poor control of body movements or difficulty walking -redness, blistering, peeling or loosening of the skin, including inside the mouth -unusual bleeding or bruising, pinpoint red spots on skin -vomiting -worsening of mood, thoughts or actions of suicide or dying -yellowing of the eyes or skin Side effects that usually do not require medical attention (report to your doctor or health care professional if they continue or are bothersome): -constipation -difficulty sleeping -excessive hair growth on the face or body -nausea This list may not describe all possible side effects. Call your doctor for medical advice about side effects. You may report side effects to FDA at 1-800-FDA-1088. Where should I keep my medicine? Keep out of the reach of children. Store at room temperature between 15 and 30 degrees C (59 and 86 degrees F). Protect from light and moisture. Throw away any unused medicine after the expiration date. NOTE: This sheet is a summary. It may not cover all possible information. If you have questions about this medicine, talk to your doctor, pharmacist, or health care provider.  2013, [employer]. (11/06/2007 11:12:39 AM)

## 2012-10-08 ENCOUNTER — Encounter: Payer: Self-pay | Admitting: Neurology

## 2012-12-03 ENCOUNTER — Inpatient Hospital Stay (HOSPITAL_COMMUNITY)
Admission: EM | Admit: 2012-12-03 | Discharge: 2012-12-06 | DRG: 244 | Disposition: A | Discharge status: Skilled Nursing Facility | Payer: Medicare Other | Attending: Internal Medicine | Admitting: Internal Medicine

## 2012-12-03 ENCOUNTER — Emergency Department (HOSPITAL_COMMUNITY): Payer: Medicare Other

## 2012-12-03 ENCOUNTER — Encounter (HOSPITAL_COMMUNITY): Payer: Self-pay | Admitting: *Deleted

## 2012-12-03 DIAGNOSIS — J8410 Pulmonary fibrosis, unspecified: Secondary | ICD-10-CM | POA: Diagnosis present

## 2012-12-03 DIAGNOSIS — I442 Atrioventricular block, complete: Secondary | ICD-10-CM

## 2012-12-03 DIAGNOSIS — G40909 Epilepsy, unspecified, not intractable, without status epilepticus: Secondary | ICD-10-CM | POA: Diagnosis present

## 2012-12-03 DIAGNOSIS — R269 Unspecified abnormalities of gait and mobility: Secondary | ICD-10-CM | POA: Diagnosis present

## 2012-12-03 DIAGNOSIS — R569 Unspecified convulsions: Secondary | ICD-10-CM

## 2012-12-03 DIAGNOSIS — I69998 Other sequelae following unspecified cerebrovascular disease: Secondary | ICD-10-CM

## 2012-12-03 DIAGNOSIS — I4729 Other ventricular tachycardia: Secondary | ICD-10-CM | POA: Diagnosis not present

## 2012-12-03 DIAGNOSIS — I472 Ventricular tachycardia, unspecified: Secondary | ICD-10-CM | POA: Diagnosis not present

## 2012-12-03 DIAGNOSIS — R55 Syncope and collapse: Secondary | ICD-10-CM

## 2012-12-03 DIAGNOSIS — I10 Essential (primary) hypertension: Secondary | ICD-10-CM | POA: Diagnosis present

## 2012-12-03 DIAGNOSIS — I4949 Other premature depolarization: Secondary | ICD-10-CM | POA: Diagnosis not present

## 2012-12-03 DIAGNOSIS — T148XXA Other injury of unspecified body region, initial encounter: Secondary | ICD-10-CM

## 2012-12-03 DIAGNOSIS — G620 Drug-induced polyneuropathy: Secondary | ICD-10-CM | POA: Diagnosis present

## 2012-12-03 DIAGNOSIS — R339 Retention of urine, unspecified: Secondary | ICD-10-CM | POA: Diagnosis not present

## 2012-12-03 DIAGNOSIS — M810 Age-related osteoporosis without current pathological fracture: Secondary | ICD-10-CM

## 2012-12-03 DIAGNOSIS — Z8546 Personal history of malignant neoplasm of prostate: Secondary | ICD-10-CM

## 2012-12-03 DIAGNOSIS — E876 Hypokalemia: Secondary | ICD-10-CM | POA: Diagnosis not present

## 2012-12-03 DIAGNOSIS — R29898 Other symptoms and signs involving the musculoskeletal system: Secondary | ICD-10-CM | POA: Diagnosis present

## 2012-12-03 DIAGNOSIS — E785 Hyperlipidemia, unspecified: Secondary | ICD-10-CM | POA: Diagnosis present

## 2012-12-03 HISTORY — PX: PACEMAKER INSERTION: SHX728

## 2012-12-03 LAB — COMPREHENSIVE METABOLIC PANEL
ALT: 11 U/L (ref 0–53)
AST: 18 U/L (ref 0–37)
Albumin: 3.4 g/dL — ABNORMAL LOW (ref 3.5–5.2)
Alkaline Phosphatase: 126 U/L — ABNORMAL HIGH (ref 39–117)
Calcium: 10 mg/dL (ref 8.4–10.5)
GFR calc Af Amer: 66 mL/min — ABNORMAL LOW (ref >90)
Potassium: 3.7 mEq/L (ref 3.5–5.1)
Sodium: 140 mEq/L (ref 135–145)
Total Protein: 7.1 g/dL (ref 6.0–8.3)

## 2012-12-03 LAB — CBC WITH DIFFERENTIAL/PLATELET
Basophils Absolute: 0 10*3/uL (ref 0.0–0.1)
Basophils Relative: 0 % (ref 0–1)
Eosinophils Absolute: 0.1 10*3/uL (ref 0.0–0.7)
Eosinophils Relative: 0 % (ref 0–5)
MCH: 35.1 pg — ABNORMAL HIGH (ref 26.0–34.0)
MCV: 96 fL (ref 78.0–100.0)
Neutrophils Relative %: 86 % — ABNORMAL HIGH (ref 43–77)
Platelets: 262 10*3/uL (ref 150–400)
RBC: 4.3 MIL/uL (ref 4.22–5.81)
RDW: 12.7 % (ref 11.5–15.5)
WBC: 13.5 10*3/uL — ABNORMAL HIGH (ref 4.0–10.5)

## 2012-12-03 LAB — POCT I-STAT, CHEM 8
Calcium, Ion: 1.2 mmol/L (ref 1.13–1.30)
Chloride: 101 mEq/L (ref 96–112)
Glucose, Bld: 154 mg/dL — ABNORMAL HIGH (ref 70–99)
HCT: 43 % (ref 39.0–52.0)
TCO2: 31 mmol/L (ref 0–100)

## 2012-12-03 LAB — APTT: aPTT: 38 seconds — ABNORMAL HIGH (ref 24–37)

## 2012-12-03 LAB — PROTIME-INR
INR: 0.97 (ref 0.00–1.49)
Prothrombin Time: 12.7 seconds (ref 11.6–15.2)

## 2012-12-03 LAB — POCT I-STAT TROPONIN I

## 2012-12-03 MED ORDER — MAGNESIUM SULFATE IN D5W 10-5 MG/ML-% IV SOLN
1.0000 g | Freq: Once | INTRAVENOUS | Status: AC
  Administered 2012-12-03: 1 g via INTRAVENOUS
  Filled 2012-12-03: qty 100

## 2012-12-03 MED ORDER — ONDANSETRON HCL 4 MG/2ML IJ SOLN: 4.0000 mg | Freq: Four times a day (QID) | INTRAMUSCULAR | Status: DC | PRN

## 2012-12-03 MED ORDER — SODIUM CHLORIDE 0.9 % IV SOLN: 250.0000 mL | INTRAVENOUS | Status: DC | PRN

## 2012-12-03 MED ORDER — SODIUM CHLORIDE 0.9 % IJ SOLN
3.0000 mL | Freq: Two times a day (BID) | INTRAMUSCULAR | Status: DC
  Administered 2012-12-04 – 2012-12-05 (×3): 3 mL via INTRAVENOUS

## 2012-12-03 MED ORDER — SODIUM CHLORIDE 0.9 % IJ SOLN
3.0000 mL | Freq: Two times a day (BID) | INTRAMUSCULAR | Status: DC
  Administered 2012-12-04: 3 mL via INTRAVENOUS

## 2012-12-03 MED ORDER — NITROGLYCERIN 0.4 MG SL SUBL: 0.4000 mg | SUBLINGUAL_TABLET | SUBLINGUAL | Status: DC | PRN

## 2012-12-03 MED ORDER — DEXTROSE-NACL 5-0.45 % IV SOLN
INTRAVENOUS | Status: DC
  Administered 2012-12-04: via INTRAVENOUS

## 2012-12-03 MED ORDER — ATROPINE SULFATE 1 MG/ML IJ SOLN: 1.0000 mg | INTRAMUSCULAR | Status: DC | PRN

## 2012-12-03 MED ORDER — HEPARIN SODIUM (PORCINE) 5000 UNIT/ML IJ SOLN
5000.0000 [IU] | Freq: Three times a day (TID) | INTRAMUSCULAR | Status: DC
  Administered 2012-12-04 – 2012-12-06 (×7): 5000 [IU] via SUBCUTANEOUS
  Filled 2012-12-03 (×10): qty 1

## 2012-12-03 MED ORDER — SODIUM CHLORIDE 0.9 % IJ SOLN: 3.0000 mL | INTRAMUSCULAR | Status: DC | PRN

## 2012-12-03 MED ORDER — ASPIRIN EC 81 MG PO TBEC
81.0000 mg | DELAYED_RELEASE_TABLET | Freq: Every day | ORAL | Status: DC
  Administered 2012-12-04 – 2012-12-06 (×3): 81 mg via ORAL
  Filled 2012-12-03 (×3): qty 1

## 2012-12-03 MED ORDER — ACETAMINOPHEN 325 MG PO TABS
650.0000 mg | ORAL_TABLET | ORAL | Status: DC | PRN
  Filled 2012-12-03: qty 2

## 2012-12-03 NOTE — ED Notes (Signed)
Patient has abrasion to the right side of his forehead and redness from the fall.  Alert and oriented.

## 2012-12-03 NOTE — H&P (Signed)
[name] Holmes is an 79 y.o. male.   Chief Complaint: Complete heart block HPI: , Patient is a 79 year old Caucasian male with history of seizure disorder on chronic Dilantin, history of stroke in remote past with residual right-sided weakness and gait instability, had activated the emergency services with neck activator brace that he  wears after he had of all and thinks he may have passed out.  He was evaluated by the EMS, and mother about to be discharged back, they went back to see him and found him again slumping and unresponsive for a few seconds. At that time he felt he needed to be in the emergency room. On presentation to the emergency room, patient has been alert awake, without any dizzy spells, but was found to be in complete heart block. I was asked to admit the patient for complete heart block and further evaluation of the same. Patient denies any chest pain, shortness breath, PND or orthopnea. He does have chronic gait instability. He states that he has not had a seizure in many years. He is a widower, lives soft, he has 2 nieces that live in [place] for a and support him. Past Medical History  Diagnosis Date  . Stroke   . Seizures   . Hypertension   . Hyperlipidemia   . Prostate cancer     radiation  . 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 Seizures 10/04/2012    Remote , last seizure in 1994. On Dilantin  100 mg bd po.    Past Surgical History  Procedure Laterality Date  . Cva  2006  . Appendectomy  1939    History reviewed. No pertinent family history. Social History:  reports that he has never smoked. He has never used smokeless tobacco. He reports that he does not drink alcohol or use illicit drugs.  Allergies:  Allergies  Allergen Reactions  . Risedronate Sodium Other (See Comments)    Severe headache  . Penicillins Other (See Comments)  . Pollen Extract Other (See Comments)   Review of Systems - Other system negative  Blood pressure 158/63, pulse 37, resp. rate 16,  height 6' (1.829 m), weight 68.04 kg (150 lb), SpO2 96.00%. General appearance: alert, cooperative, appears stated age and frail Eyes: FROM Neck: no adenopathy, no carotid bruit, no JVD, supple, symmetrical, trachea midline and thyroid not enlarged, symmetric, no tenderness/mass/nodules Neck: JVP - normal, carotids 2+= without bruits Resp: clear to auscultation bilaterally Chest wall: no tenderness Cardio: regular rate and rhythm, S1, S2 normal, no murmur, click, rub or gallop and distant heart sounds GI: soft, non-tender; bowel sounds normal; no masses,  no organomegaly Extremities: extremities normal, atraumatic, no cyanosis or edema Pulses: Carotids were 2+, femorals were 2+, no bruit. Pedal pulses were 1+ bilateral Skin: Skin color, texture, turgor normal. No rashes or lesions   Results for orders placed during the hospital encounter of 12/03/12 (from the past 48 hour(s))  GLUCOSE, CAPILLARY     Status: Abnormal   Collection Time    12/03/12  9:05 PM      Result Value Range   Glucose-Capillary 136 (*) 70 - 99 mg/dL  POCT I-STAT TROPONIN I     Status: None   Collection Time    12/03/12  9:07 PM      Result Value Range   Troponin i, poc 0.03  0.00 - 0.08 ng/mL   Comment 3            Comment: Due to the release  kinetics of cTnI,     a negative result within the first hours     of the onset of symptoms does not rule out     myocardial infarction with certainty.     If myocardial infarction is still suspected,     repeat the test at appropriate intervals.  CBC WITH DIFFERENTIAL     Status: Abnormal   Collection Time    12/03/12  9:12 PM      Result Value Range   WBC 13.5 (*) 4.0 - 10.5 K/uL   RBC 4.30  4.22 - 5.81 MIL/uL   Hemoglobin 15.1  13.0 - 17.0 g/dL   HCT 16.1  09.6 - 04.5 %   MCV 96.0  78.0 - 100.0 fL   MCH 35.1 (*) 26.0 - 34.0 pg   MCHC 36.6 (*) 30.0 - 36.0 g/dL   RDW 40.9  81.1 - 91.4 %   Platelets 262  150 - 400 K/uL   Neutrophils Relative % 86 (*) 43 - 77 %    Neutro Abs 11.5 (*) 1.7 - 7.7 K/uL   Lymphocytes Relative 5 (*) 12 - 46 %   Lymphs Abs 0.7  0.7 - 4.0 K/uL   Monocytes Relative 8  3 - 12 %   Monocytes Absolute 1.1 (*) 0.1 - 1.0 K/uL   Eosinophils Relative 0  0 - 5 %   Eosinophils Absolute 0.1  0.0 - 0.7 K/uL   Basophils Relative 0  0 - 1 %   Basophils Absolute 0.0  0.0 - 0.1 K/uL  COMPREHENSIVE METABOLIC PANEL     Status: Abnormal   Collection Time    12/03/12  9:12 PM      Result Value Range   Sodium 140  135 - 145 mEq/L   Potassium 3.7  3.5 - 5.1 mEq/L   Chloride 99  96 - 112 mEq/L   CO2 30  19 - 32 mEq/L   Glucose, Bld 155 (*) 70 - 99 mg/dL   BUN 27 (*) 6 - 23 mg/dL   Creatinine, Ser 7.82  0.50 - 1.35 mg/dL   Calcium 95.6  8.4 - 21.3 mg/dL   Total Protein 7.1  6.0 - 8.3 g/dL   Albumin 3.4 (*) 3.5 - 5.2 g/dL   AST 18  0 - 37 U/L   ALT 11  0 - 53 U/L   Alkaline Phosphatase 126 (*) 39 - 117 U/L   Total Bilirubin 0.3  0.3 - 1.2 mg/dL   GFR calc non Af Amer 57 (*) >90 mL/min   GFR calc Af Amer 66 (*) >90 mL/min   Comment: (NOTE)     The eGFR has been calculated using the CKD EPI equation.     This calculation has not been validated in all clinical situations.     eGFR's persistently <90 mL/min signify possible Chronic Kidney     Disease.  MAGNESIUM     Status: None   Collection Time    12/03/12  9:12 PM      Result Value Range   Magnesium 1.8  1.5 - 2.5 mg/dL  PROTIME-INR     Status: None   Collection Time    12/03/12  9:12 PM      Result Value Range   Prothrombin Time 12.7  11.6 - 15.2 seconds   INR 0.97  0.00 - 1.49  APTT     Status: Abnormal   Collection Time    12/03/12  9:12 PM  Result Value Range   aPTT 38 (*) 24 - 37 seconds   Comment:            IF BASELINE aPTT IS ELEVATED,     SUGGEST PATIENT RISK ASSESSMENT     BE USED TO DETERMINE APPROPRIATE     ANTICOAGULANT THERAPY.  POCT I-STAT TROPONIN I     Status: None   Collection Time    12/03/12  9:59 PM      Result Value Range   Troponin i, poc  0.04  0.00 - 0.08 ng/mL   Comment 3            Comment: Due to the release kinetics of cTnI,     a negative result within the first hours     of the onset of symptoms does not rule out     myocardial infarction with certainty.     If myocardial infarction is still suspected,     repeat the test at appropriate intervals.  POCT I-STAT, CHEM 8     Status: Abnormal   Collection Time    12/03/12 10:01 PM      Result Value Range   Sodium 139  135 - 145 mEq/L   Potassium 3.8  3.5 - 5.1 mEq/L   Chloride 101  96 - 112 mEq/L   BUN 27 (*) 6 - 23 mg/dL   Creatinine, Ser 1.91  0.50 - 1.35 mg/dL   Glucose, Bld 478 (*) 70 - 99 mg/dL   Calcium, Ion 2.95  6.21 - 1.30 mmol/L   TCO2 31  0 - 100 mmol/L   Hemoglobin 14.6  13.0 - 17.0 g/dL   HCT 30.8  65.7 - 84.6 %  CG4 I-STAT (LACTIC ACID)     Status: None   Collection Time    12/03/12 10:02 PM      Result Value Range   Lactic Acid, Venous 1.12  0.5 - 2.2 mmol/L   Ct Head Wo Contrast  12/03/2012   *RADIOLOGY REPORT*  Clinical Data:  Abrasion, fall  CT HEAD WITHOUT CONTRAST CT CERVICAL SPINE WITHOUT CONTRAST  Technique:  Multidetector CT imaging of the head and cervical spine was performed following the standard protocol without intravenous contrast.  Multiplanar CT image reconstructions of the cervical spine were also generated.  Comparison:  Head CT 02/25/2011  CT HEAD  Findings: No acute intracranial hemorrhage.  No focal mass lesion. No CT evidence of acute infarction.   No midline shift or mass effect.  No hydrocephalus.  Basilar cisterns are patent.  There is extensive ventricular dilatation which is increased from comparison exam.  There is  dilatation of the lateral ventricles which extended to the temporal horns.  Third ventricle  and fourth ventricle are dilated.  These findings again are similar but increased compared to CT 02/25/2011.  There is generalized cortical atrophy.  Paranasal sinuses and mastoid air cells are clear.  Orbits are normal.   IMPRESSION:  1.  No evidence of intracranial trauma. 2.  Marked ventriculomegaly which is present on CT of 02/25/2011 but progressively worsened in the interval.  No obstructing lesion identified.  CT CERVICAL SPINE  Findings: No prevertebral soft tissue swelling.  No acute loss vertebral body height and there is multiple levels of endplate osteophytosis and disc space narrowing.  Normal facet to dilation. Normal craniocervical junction.  No evidence epidural paraspinal hematoma.  IMPRESSION: 1.  No evidence cervical spine fracture.  2.  Multilevel disc osteophytic disease.   Original Report  Authenticated By: Genevive Bi, M.D.   Ct Cervical Spine Wo Contrast  12/03/2012   *RADIOLOGY REPORT*  Clinical Data:  Abrasion, fall  CT HEAD WITHOUT CONTRAST CT CERVICAL SPINE WITHOUT CONTRAST  Technique:  Multidetector CT imaging of the head and cervical spine was performed following the standard protocol without intravenous contrast.  Multiplanar CT image reconstructions of the cervical spine were also generated.  Comparison:  Head CT 02/25/2011  CT HEAD  Findings: No acute intracranial hemorrhage.  No focal mass lesion. No CT evidence of acute infarction.   No midline shift or mass effect.  No hydrocephalus.  Basilar cisterns are patent.  There is extensive ventricular dilatation which is increased from comparison exam.  There is  dilatation of the lateral ventricles which extended to the temporal horns.  Third ventricle  and fourth ventricle are dilated.  These findings again are similar but increased compared to CT 02/25/2011.  There is generalized cortical atrophy.  Paranasal sinuses and mastoid air cells are clear.  Orbits are normal.  IMPRESSION:  1.  No evidence of intracranial trauma. 2.  Marked ventriculomegaly which is present on CT of 02/25/2011 but progressively worsened in the interval.  No obstructing lesion identified.  CT CERVICAL SPINE  Findings: No prevertebral soft tissue swelling.  No acute loss  vertebral body height and there is multiple levels of endplate osteophytosis and disc space narrowing.  Normal facet to dilation. Normal craniocervical junction.  No evidence epidural paraspinal hematoma.  IMPRESSION: 1.  No evidence cervical spine fracture.  2.  Multilevel disc osteophytic disease.   Original Report Authenticated By: Genevive Bi, M.D.   Dg Chest Portable 1 View  12/03/2012   *RADIOLOGY REPORT*  Clinical Data: Loss of consciousness.  PORTABLE CHEST - 1 VIEW  Comparison: 02/25/2011 and 11/08/2010  Findings: Heart size and pulmonary vascularity are normal.  The patient has chronic interstitial lung disease as at the bases, possibly representing pulmonary fibrosis.  No acute infiltrates or effusions.  No acute osseous abnormality.  Old healed fracture of the right clavicle.  IMPRESSION: No acute abnormalities.  Chronic interstitial lung disease.   Original Report Authenticated By: Francene Boyers, M.D.    Labs:   Lab Results  Component Value Date   WBC 13.5* 12/03/2012   HGB 14.6 12/03/2012   HCT 43.0 12/03/2012   MCV 96.0 12/03/2012   PLT 262 12/03/2012    Recent Labs Lab 12/03/12 2112 12/03/12 2201  NA 140 139  K 3.7 3.8  CL 99 101  CO2 30  --   BUN 27* 27*  CREATININE 1.13 1.30  CALCIUM 10.0  --   PROT 7.1  --   BILITOT 0.3  --   ALKPHOS 126*  --   ALT 11  --   AST 18  --   GLUCOSE 155* 154*   Lab Results  Component Value Date   CKTOTAL 34 11/09/2010   CKMB 2.8 11/09/2010   TROPONINI <0.30 11/08/2010     EKG: 12/03/2012: Complete heart block with ventricular escape at a rate of 35 beats a minute..   Assessment/Plan 1. Syncope secondary to complete heart block 2. Hypertension 3. History of pulmonary fibrosis 4. History of stroke in the remote past with residual right-sided weakness 5. Gait instability  Recommendation: I will admit the patient in the CCU and as his vital signs are stable, I will continue to watch him and he will need permanent pacemaker  implantation. I will certainly check his Dilantin levels to make sure  that he is not supratherapeutic. He is also on a small dose of Persantine, could potentially cause complete heart block but unlikely. Otherwise not on any other negative chronotropic agents.  Pamella Pert, MD 12/03/2012, 10:55 PM [facility] Pager: [phone] Office: [phone] If no answer: Cell:  [phone]

## 2012-12-03 NOTE — ED Provider Notes (Signed)
CSN: [id number]     Arrival date & time 12/03/12  2052 History     First MD Initiated Contact with Patient 12/03/12 2110     Chief Complaint  Patient presents with  . Loss of Consciousness   (Consider location/radiation/quality/duration/timing/severity/associated sxs/prior Treatment) The history is provided by the patient and the EMS personnel.   79 year old male with history of stroke, seizure disorder on Dilantin, hypertension, hyperlipidemia presents with syncope. Patient reports he was in the bathroom, and then does not remember what happened however woke up on the floor and called EMS. Per EMS he was helped up and as his Lamictal to have a 10 second episode of unresponsiveness where dated report they were unable to obtain a radial pulse. EMS also questioned possible episode of V. tach on the monitor for 5 seconds after this episode. Patient received aspirin en route. The patient reports feeling much better on arrival to the emergency department, however has some continuing fatigue. Denies any preceding chest pain or shortness of breath prior to the syncopal episode. Visit he has a seizure history however has not had a seizure in many years and is compliant with his Dilantin. Denies any tongue biting. Notes mild headache at this time and denies neck pain. Reports he is up-to-date on tetanus. Denies any history of prior cardiac disease, prior history of PE or DVT. Denies any other recent illnesses.  Patient is not sure how long he had syncopized for.   Past Medical History  Diagnosis Date  . Stroke   . Seizures   . Hypertension   . Hyperlipidemia   . Prostate cancer     radiation  . 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 Seizures 10/04/2012    Remote , last seizure in 1994. On Dilantin  100 mg bd po.   Past Surgical History  Procedure Laterality Date  . Cva  2006  . Appendectomy  1939   History reviewed. No pertinent family history. History  Substance Use Topics  . Smoking status:  Never Smoker   . Smokeless tobacco: Never Used  . Alcohol Use: No    Review of Systems  Constitutional: Positive for fatigue. Negative for fever.  HENT: Negative for sore throat and neck stiffness.   Eyes: Negative for visual disturbance.  Respiratory: Negative for shortness of breath.   Cardiovascular: Negative for chest pain, palpitations and leg swelling.  Gastrointestinal: Negative for nausea, vomiting and abdominal pain.  Genitourinary: Negative for difficulty urinating.  Musculoskeletal: Negative for back pain.  Skin: Negative for rash.  Neurological: Negative for syncope and headaches.    Allergies  Risedronate sodium; Penicillins; and Pollen extract  Home Medications   No current outpatient prescriptions on file. BP 156/41  Pulse 33  Temp(Src) 97.6 F (36.4 C) (Oral)  Resp 14  Ht 5\' 9"  (1.753 m)  Wt 151 lb 0.2 oz (68.5 kg)  BMI 22.29 kg/m2  SpO2 97% Physical Exam  Nursing note and vitals reviewed. Constitutional: He is oriented to person, place, and time. He appears well-developed and well-nourished. No distress.  HENT:  Head: Normocephalic and atraumatic.  Mouth/Throat: Oropharynx is clear and moist. No oropharyngeal exudate.  Eyes: Conjunctivae are normal.  Neck: Normal range of motion.  No midline tenderness  Cardiovascular: Regular rhythm, normal heart sounds and intact distal pulses.  Bradycardia present.  Exam reveals no gallop and no friction rub.   No murmur heard. Pulmonary/Chest: Effort normal and breath sounds normal. No respiratory distress. He has no wheezes.  He has no rales.  Abdominal: Soft. He exhibits no distension. There is no tenderness. There is no guarding.  Musculoskeletal: He exhibits no edema and no tenderness.  Neurological: He is alert and oriented to person, place, and time.  Skin: Skin is warm and dry. He is not diaphoretic.  Abrasion forehead    ED Course   Procedures (including critical care time)  Labs Reviewed  CBC WITH  DIFFERENTIAL - Abnormal; Notable for the following:    WBC 13.5 (*)    MCH 35.1 (*)    MCHC 36.6 (*)    Neutrophils Relative % 86 (*)    Neutro Abs 11.5 (*)    Lymphocytes Relative 5 (*)    Monocytes Absolute 1.1 (*)    All other components within normal limits  COMPREHENSIVE METABOLIC PANEL - Abnormal; Notable for the following:    Glucose, Bld 155 (*)    BUN 27 (*)    Albumin 3.4 (*)    Alkaline Phosphatase 126 (*)    GFR calc non Af Amer 57 (*)    GFR calc Af Amer 66 (*)    All other components within normal limits  APTT - Abnormal; Notable for the following:    aPTT 38 (*)    All other components within normal limits  GLUCOSE, CAPILLARY - Abnormal; Notable for the following:    Glucose-Capillary 136 (*)    All other components within normal limits  PHENYTOIN LEVEL, TOTAL - Abnormal; Notable for the following:    Phenytoin Lvl 7.6 (*)    All other components within normal limits  POCT I-STAT, CHEM 8 - Abnormal; Notable for the following:    BUN 27 (*)    Glucose, Bld 154 (*)    All other components within normal limits  MRSA PCR SCREENING  MAGNESIUM  PROTIME-INR  URINALYSIS, ROUTINE W REFLEX MICROSCOPIC  BASIC METABOLIC PANEL  CBC  POCT I-STAT TROPONIN I  CG4 I-STAT (LACTIC ACID)  POCT I-STAT TROPONIN I   Ct Head Wo Contrast  12/03/2012   *RADIOLOGY REPORT*  Clinical Data:  Abrasion, fall  CT HEAD WITHOUT CONTRAST CT CERVICAL SPINE WITHOUT CONTRAST  Technique:  Multidetector CT imaging of the head and cervical spine was performed following the standard protocol without intravenous contrast.  Multiplanar CT image reconstructions of the cervical spine were also generated.  Comparison:  Head CT 02/25/2011  CT HEAD  Findings: No acute intracranial hemorrhage.  No focal mass lesion. No CT evidence of acute infarction.   No midline shift or mass effect.  No hydrocephalus.  Basilar cisterns are patent.  There is extensive ventricular dilatation which is increased from comparison  exam.  There is  dilatation of the lateral ventricles which extended to the temporal horns.  Third ventricle  and fourth ventricle are dilated.  These findings again are similar but increased compared to CT 02/25/2011.  There is generalized cortical atrophy.  Paranasal sinuses and mastoid air cells are clear.  Orbits are normal.  IMPRESSION:  1.  No evidence of intracranial trauma. 2.  Marked ventriculomegaly which is present on CT of 02/25/2011 but progressively worsened in the interval.  No obstructing lesion identified.  CT CERVICAL SPINE  Findings: No prevertebral soft tissue swelling.  No acute loss vertebral body height and there is multiple levels of endplate osteophytosis and disc space narrowing.  Normal facet to dilation. Normal craniocervical junction.  No evidence epidural paraspinal hematoma.  IMPRESSION: 1.  No evidence cervical spine fracture.  2.  Multilevel disc osteophytic disease.   Original Report Authenticated By: Genevive Bi, M.D.   Ct Cervical Spine Wo Contrast  12/03/2012   *RADIOLOGY REPORT*  Clinical Data:  Abrasion, fall  CT HEAD WITHOUT CONTRAST CT CERVICAL SPINE WITHOUT CONTRAST  Technique:  Multidetector CT imaging of the head and cervical spine was performed following the standard protocol without intravenous contrast.  Multiplanar CT image reconstructions of the cervical spine were also generated.  Comparison:  Head CT 02/25/2011  CT HEAD  Findings: No acute intracranial hemorrhage.  No focal mass lesion. No CT evidence of acute infarction.   No midline shift or mass effect.  No hydrocephalus.  Basilar cisterns are patent.  There is extensive ventricular dilatation which is increased from comparison exam.  There is  dilatation of the lateral ventricles which extended to the temporal horns.  Third ventricle  and fourth ventricle are dilated.  These findings again are similar but increased compared to CT 02/25/2011.  There is generalized cortical atrophy.  Paranasal sinuses and  mastoid air cells are clear.  Orbits are normal.  IMPRESSION:  1.  No evidence of intracranial trauma. 2.  Marked ventriculomegaly which is present on CT of 02/25/2011 but progressively worsened in the interval.  No obstructing lesion identified.  CT CERVICAL SPINE  Findings: No prevertebral soft tissue swelling.  No acute loss vertebral body height and there is multiple levels of endplate osteophytosis and disc space narrowing.  Normal facet to dilation. Normal craniocervical junction.  No evidence epidural paraspinal hematoma.  IMPRESSION: 1.  No evidence cervical spine fracture.  2.  Multilevel disc osteophytic disease.   Original Report Authenticated By: Genevive Bi, M.D.   Dg Chest Portable 1 View  12/03/2012   *RADIOLOGY REPORT*  Clinical Data: Loss of consciousness.  PORTABLE CHEST - 1 VIEW  Comparison: 02/25/2011 and 11/08/2010  Findings: Heart size and pulmonary vascularity are normal.  The patient has chronic interstitial lung disease as at the bases, possibly representing pulmonary fibrosis.  No acute infiltrates or effusions.  No acute osseous abnormality.  Old healed fracture of the right clavicle.  IMPRESSION: No acute abnormalities.  Chronic interstitial lung disease.   Original Report Authenticated By: Francene Boyers, M.D.   Date: 12/04/2012  Rate: 38  Rhythm: av dissociation  QRS Axis: left  Intervals: indeterminate  ST/T Wave abnormalities: nonspecific ST changes  Conduction Disutrbances:complete heart block  Narrative Interpretation:   Old EKG Reviewed: changes noted   1. Complete heart block   2. Syncope   3. Abrasion     MDM  79 year old male with history of stroke, seizure disorder on Dilantin, hypertension, hyperlipidemia presents with syncope.  The patient arrived to the emergency department noted to be bradycardiac with a heart rate of 40, normal respirations normal saturation on room air, and hypertensive with a blood pressure 197/58.  Patient alert and oriented  appropriately answering questions. EKG is significant for new complete heart block with a heart rate of 38. CT head and C-spine ordered given patient's syncope with evidence of head trauma, and showed no acute abnormalities.  Done in within normal limits. Chest x-ray shows no signs of heart failure or pneumonia. Cardiology was consulted regarding patient's complete heart block and will admit patient to the ICU with likely pacemaker placement in the morning. Patient remained cardiac in the emergency department, however with normal mental status and hypertensive blood pressures and without concerns.  Rhae Lerner, MD 12/04/12 [phone]

## 2012-12-03 NOTE — ED Notes (Signed)
Patient presents from home via EMS  Call was for patient falling in the BR and needed help getting up.  EMS assisted him to the couch and did a quick assessment and as they were leaving he fell back on the couch turned gray with snoring resp and unable to get any radial pulses.  Per EMS ?VT on the monitor approx 5 secs later he opened his eyes and asked what happened.  EMS reports that he had 2 runs of what looked like VT that lasted about 5 seconds while enroute.

## 2012-12-03 NOTE — ED Notes (Addendum)
Spoke with niece Diane [phone] who is joint POA with sister Kerry Dory [phone].  She reviewed his medications and history.  Genella Rife Red [phone] helps with his care, Delia Heady [phone] is a friend

## 2012-12-04 ENCOUNTER — Encounter (HOSPITAL_COMMUNITY): Payer: Self-pay | Admitting: *Deleted

## 2012-12-04 ENCOUNTER — Encounter (HOSPITAL_COMMUNITY)
Admission: EM | Disposition: A | Discharge status: Skilled Nursing Facility | Payer: Self-pay | Source: Home / Self Care | Attending: Cardiology

## 2012-12-04 DIAGNOSIS — I442 Atrioventricular block, complete: Secondary | ICD-10-CM

## 2012-12-04 HISTORY — PX: PERMANENT PACEMAKER INSERTION: SHX5480

## 2012-12-04 LAB — URINALYSIS, ROUTINE W REFLEX MICROSCOPIC
Bilirubin Urine: NEGATIVE
Glucose, UA: NEGATIVE mg/dL
Hgb urine dipstick: NEGATIVE
Nitrite: NEGATIVE
Specific Gravity, Urine: 1.016 (ref 1.005–1.030)
pH: 6.5 (ref 5.0–8.0)

## 2012-12-04 LAB — CBC
HCT: 39 % (ref 39.0–52.0)
Hemoglobin: 14.6 g/dL (ref 13.0–17.0)
MCH: 35.5 pg — ABNORMAL HIGH (ref 26.0–34.0)
MCHC: 37.4 g/dL — ABNORMAL HIGH (ref 30.0–36.0)
MCV: 94.9 fL (ref 78.0–100.0)

## 2012-12-04 LAB — CK TOTAL AND CKMB (NOT AT ARMC)
CK, MB: 4.7 ng/mL — ABNORMAL HIGH (ref 0.3–4.0)
Relative Index: INVALID (ref 0.0–2.5)
Total CK: 44 U/L (ref 7–232)

## 2012-12-04 LAB — BASIC METABOLIC PANEL
BUN: 25 mg/dL — ABNORMAL HIGH (ref 6–23)
CO2: 26 mEq/L (ref 19–32)
Chloride: 102 mEq/L (ref 96–112)
Creatinine, Ser: 1.1 mg/dL (ref 0.50–1.35)
Glucose, Bld: 159 mg/dL — ABNORMAL HIGH (ref 70–99)
Potassium: 3.2 mEq/L — ABNORMAL LOW (ref 3.5–5.1)

## 2012-12-04 LAB — COMPREHENSIVE METABOLIC PANEL
Albumin: 3.1 g/dL — ABNORMAL LOW (ref 3.5–5.2)
Alkaline Phosphatase: 113 U/L (ref 39–117)
BUN: 24 mg/dL — ABNORMAL HIGH (ref 6–23)
Calcium: 9.4 mg/dL (ref 8.4–10.5)
Creatinine, Ser: 1.14 mg/dL (ref 0.50–1.35)
Potassium: 3.2 mEq/L — ABNORMAL LOW (ref 3.5–5.1)
Total Protein: 6.2 g/dL (ref 6.0–8.3)

## 2012-12-04 LAB — TROPONIN I: Troponin I: <0.3 ng/mL (ref <0.30)

## 2012-12-04 LAB — MRSA PCR SCREENING: MRSA by PCR: NEGATIVE

## 2012-12-04 SURGERY — PERMANENT PACEMAKER INSERTION
Anesthesia: LOCAL

## 2012-12-04 MED ORDER — POTASSIUM CHLORIDE CRYS ER 20 MEQ PO TBCR
EXTENDED_RELEASE_TABLET | ORAL | Status: AC
  Filled 2012-12-04: qty 2

## 2012-12-04 MED ORDER — ONDANSETRON HCL 4 MG/2ML IJ SOLN: 4.0000 mg | Freq: Four times a day (QID) | INTRAMUSCULAR | Status: DC | PRN

## 2012-12-04 MED ORDER — POTASSIUM CHLORIDE 20 MEQ PO PACK: 40.0000 meq | PACK | Freq: Once | ORAL | Status: DC

## 2012-12-04 MED ORDER — LIDOCAINE HCL (PF) 1 % IJ SOLN
INTRAMUSCULAR | Status: AC
  Filled 2012-12-04: qty 60

## 2012-12-04 MED ORDER — SIMVASTATIN 20 MG PO TABS
20.0000 mg | ORAL_TABLET | Freq: Every day | ORAL | Status: DC
  Administered 2012-12-04 – 2012-12-05 (×3): 20 mg via ORAL
  Filled 2012-12-04 (×4): qty 1

## 2012-12-04 MED ORDER — FOLIC ACID 1 MG PO TABS
1.0000 mg | ORAL_TABLET | Freq: Every day | ORAL | Status: DC
  Administered 2012-12-04 – 2012-12-06 (×3): 1 mg via ORAL
  Filled 2012-12-04 (×3): qty 1

## 2012-12-04 MED ORDER — POTASSIUM CHLORIDE CRYS ER 20 MEQ PO TBCR
40.0000 meq | EXTENDED_RELEASE_TABLET | Freq: Once | ORAL | Status: AC
  Administered 2012-12-04: 40 meq via ORAL

## 2012-12-04 MED ORDER — AMLODIPINE BESYLATE 5 MG PO TABS
5.0000 mg | ORAL_TABLET | Freq: Every day | ORAL | Status: DC
  Administered 2012-12-04 – 2012-12-06 (×3): 5 mg via ORAL
  Filled 2012-12-04 (×3): qty 1

## 2012-12-04 MED ORDER — SODIUM CHLORIDE 0.9 % IR SOLN
Freq: Once | Status: DC
  Filled 2012-12-04: qty 2

## 2012-12-04 MED ORDER — CHLORHEXIDINE GLUCONATE 4 % EX LIQD
CUTANEOUS | Status: AC
  Administered 2012-12-04: 15:00:00
  Filled 2012-12-04: qty 45

## 2012-12-04 MED ORDER — MAGNESIUM SULFATE 40 MG/ML IJ SOLN
2.0000 g | Freq: Once | INTRAMUSCULAR | Status: AC
  Administered 2012-12-04: 2 g via INTRAVENOUS
  Filled 2012-12-04: qty 50

## 2012-12-04 MED ORDER — HYDRALAZINE HCL 20 MG/ML IJ SOLN
10.0000 mg | INTRAMUSCULAR | Status: DC | PRN
  Administered 2012-12-04: 10 mg via INTRAVENOUS
  Filled 2012-12-04: qty 1

## 2012-12-04 MED ORDER — ACETAMINOPHEN 325 MG PO TABS
325.0000 mg | ORAL_TABLET | ORAL | Status: DC | PRN
  Administered 2012-12-04 – 2012-12-05 (×3): 650 mg via ORAL
  Filled 2012-12-04 (×2): qty 2

## 2012-12-04 MED ORDER — VANCOMYCIN HCL IN DEXTROSE 1-5 GM/200ML-% IV SOLN
1000.0000 mg | Freq: Once | INTRAVENOUS | Status: DC
  Filled 2012-12-04 (×2): qty 200

## 2012-12-04 MED ORDER — VANCOMYCIN HCL IN DEXTROSE 1-5 GM/200ML-% IV SOLN
1000.0000 mg | Freq: Two times a day (BID) | INTRAVENOUS | Status: AC
  Administered 2012-12-05: 1000 mg via INTRAVENOUS
  Filled 2012-12-04: qty 200

## 2012-12-04 MED ORDER — HYDRALAZINE HCL 25 MG PO TABS
25.0000 mg | ORAL_TABLET | Freq: Four times a day (QID) | ORAL | Status: DC
  Administered 2012-12-04 – 2012-12-06 (×9): 25 mg via ORAL
  Filled 2012-12-04 (×14): qty 1

## 2012-12-04 MED ORDER — MIDAZOLAM HCL 5 MG/5ML IJ SOLN
INTRAMUSCULAR | Status: AC
  Filled 2012-12-04: qty 5

## 2012-12-04 MED ORDER — FENTANYL CITRATE 0.05 MG/ML IJ SOLN
INTRAMUSCULAR | Status: AC
  Filled 2012-12-04: qty 2

## 2012-12-04 MED ORDER — SODIUM CHLORIDE 0.9 % IV SOLN
INTRAVENOUS | Status: DC
  Administered 2012-12-04 (×2): via INTRAVENOUS

## 2012-12-04 MED ORDER — PHENYTOIN SODIUM EXTENDED 100 MG PO CAPS
100.0000 mg | ORAL_CAPSULE | Freq: Two times a day (BID) | ORAL | Status: DC
  Administered 2012-12-04 – 2012-12-06 (×6): 100 mg via ORAL
  Filled 2012-12-04 (×7): qty 1

## 2012-12-04 NOTE — ED Provider Notes (Signed)
Medical screening examination/treatment/procedure(s) were conducted as a shared visit with resident physician and myself.  I personally evaluated the patient during the encounter.  I interviewed and examined the pt who is here w/ syncopal event. Ecg concerning for complete heart block. Critical care time included focused medical decision making, repeat evaluations of the patient, consultation with cardiology.   CRITICAL CARE Performed by: Purvis Sheffield, S Total critical care time: 30 min Critical care time was exclusive of separately billable procedures and treating other patients. Critical care was necessary to treat or prevent imminent or life-threatening deterioration. Critical care was time spent personally by me on the following activities: development of treatment plan with patient and/or surrogate as well as nursing, discussions with consultants, evaluation of patient's response to treatment, examination of patient, obtaining history from patient or surrogate, ordering and performing treatments and interventions, ordering and review of laboratory studies, ordering and review of radiographic studies, pulse oximetry and re-evaluation of patient's condition.   Junius Argyle, MD 12/04/12 1134

## 2012-12-04 NOTE — Progress Notes (Signed)
Pt c/o unable to void-bladder u/s done=189.

## 2012-12-04 NOTE — Progress Notes (Signed)
Subjective:  Patient had an episode of ventricular tachycardia, TDP,  Needing defibrillation by the nursing staff.  Patient had frequent PVCs prior to the onset of TDP, was straining to pass urine leading to worsening of PVCs and eventually disintegrated into ventricular tachycardia.  No chest pain or shortness of breath was described.  Patient essentially states that he is doing well and was in fact joking with me stating I do not want procedures and then winked his eyes and stated he is ready for the procedure for pacemaker.  Objective:  Vital Signs in the last 24 hours: Temp:  [97.6 F (36.4 C)-98.9 F (37.2 C)] 98.1 F (36.7 C) (08/19 2000) Pulse Rate:  [32-80] 80 (08/19 2000) Resp:  [10-25] 24 (08/19 2000) BP: (105-221)/(38-110) 144/80 mmHg (08/19 2000) SpO2:  [92 %-100 %] 94 % (08/19 2000) Weight:  [68 kg (149 lb 14.6 oz)-68.5 kg (151 lb 0.2 oz)] 68 kg (149 lb 14.6 oz) (08/19 0800)  Intake/Output from previous day: 08/18 0701 - 08/19 0700 In: 750 [I.V.:750] Out: -   Physical Exam: General appearance: alert, cooperative, appears stated age and frail  Eyes: FROM  Neck: no adenopathy, no carotid bruit, no JVD, supple, symmetrical, trachea midline and thyroid not enlarged, symmetric, no tenderness/mass/nodules  Neck: JVP - normal, carotids 2+= without bruits  Resp: clear to auscultation bilaterally  Chest wall: no tenderness  Cardio: regular rate and rhythm, S1, S2 normal, no murmur, click, rub or gallop and distant heart sounds  GI: soft, non-tender; bowel sounds normal; no masses, no organomegaly  Extremities: extremities normal, atraumatic, no cyanosis or edema  Pulses: Carotids were 2+, femorals were 2+, no bruit. Pedal pulses were 1+ bilateral  Skin: Skin color, texture, turgor normal. No rashes or lesions Lab Results:  Recent Labs  12/03/12 2112 12/03/12 2201 12/04/12 0530  WBC 13.5*  --  12.0*  HGB 15.1 14.6 14.6  PLT 262  --  281    Recent Labs  12/04/12 0530  12/04/12 0805  NA 140 137  K 3.2* 3.2*  CL 102 100  CO2 26 24  GLUCOSE 159* 152*  BUN 25* 24*  CREATININE 1.10 1.14    Recent Labs  12/04/12 0805  TROPONINI <0.30   Hepatic Function Panel  Recent Labs  12/04/12 0805  PROT 6.2  ALBUMIN 3.1*  AST 17  ALT 11  ALKPHOS 113  BILITOT 0.4   EKG: EKG performed during wide complex tachycardia reveals sustained VT, TDP.  Assessment/Plan:  1.  Complete heart block 2.  Ventricular tachycardia, TDP needing defibrillation probably precipitated by patient's straining during micturition.  Patient has history of prostate cancer and had retention of 500 cc of urine.  Recommendation: I discussed with Dr. Sharrell Ku regarding the fact that patient has no chest pain or shortness of breath, patient is frail and I would not proceed with coronary angiography unless he felt it was necessary to exclude ischemic etiology.  Dr. Sharrell Ku was gracious enough to state that this is probably all related to sick sinus syndrome and AV nodal disease, but patient would benefit from placement of permanent pacemaker given his frail nature, no further aggressive measures including coronary angiography was indicated and he will certainly be benefited by use of beta blockers.  Hence at this point patient will be kept nothing by mouth and will proceed with placement of permanent pacemaker as planned by Dr. Sharrell Ku.  I appreciate his support and his thoughts.   Pamella Pert, M.D. 12/04/2012, 8:56  PM [facility], [place] Pager: [phone] Office: [phone] If no answer: [phone]

## 2012-12-04 NOTE — Care Management Note (Addendum)
    Page 1 of 1   12/05/2012     9:31:56 AM   CARE MANAGEMENT NOTE 12/05/2012  Patient:  [name] Holmes, [name] Holmes   Account Number:  [id number]  Date Initiated:  12/04/2012  Documentation initiated by:  Junius Creamer  Subjective/Objective Assessment:   adm w complete heart block     Action/Plan:   lives alone, chart states has aid, pcp dr Juliann Pulse   Anticipated DC Date:     Anticipated DC Plan:    In-house referral  Clinical Social Worker      DC Planning Services  CM consult      Choice offered to / List presented to:             Status of service:   Medicare Important Message given?   (If response is "NO", the following Medicare IM given date fields will be blank) Date Medicare IM given:   Date Additional Medicare IM given:    Discharge Disposition:    Per UR Regulation:  Reviewed for med. necessity/level of care/duration of stay  If discussed at Long Length of Stay Meetings, dates discussed:    Comments:  8/20 0930 debbie Jenkins Risdon rn,bsn pt got up w staff, very unsteady. pt/ot ordered. niece who is poa req snf. sw ref has been made.

## 2012-12-04 NOTE — Consult Note (Addendum)
Chief Complaint: Complete heart block  HPI: , Patient is a 79 year old Caucasian male with history of seizure disorder on chronic Dilantin, history of stroke in remote past with residual right-sided weakness and gait instability, who is admitted today for evaluation of near-syncope. The patient was seen by the paramedics earlier in the day after having a near syncopal episode. He was simply found to have complete heart block.  Patient denies any chest pain, shortness breath, PND or orthopnea. He does have chronic gait instability. He was admitted to the hospital and on telemetry he developed torsades. He was defibrillated. He is referred for additional evaluation. The patient has no reversible causes for his syncope. He has significant conduction system disease on his ECG. He states that he has not had a seizure in many years. He is a widower, lives soft, he has 2 nieces that live in [place] for a and support him.  Past Medical History   Diagnosis  Date   .  Stroke    .  Seizures    .  Hypertension    .  Hyperlipidemia    .  Prostate cancer      radiation   .  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  Seizures  10/04/2012     Remote , last seizure in 1994. On Dilantin 100 mg bd po.    Past Surgical History   Procedure  Laterality  Date   .  Cva   2006   .  Appendectomy   1939    Family  History reviewed. No pertinent family history.  Social History: reports that he has never smoked. He has never used smokeless tobacco. He reports that he does not drink alcohol or use illicit drugs.  Allergies:  Allergies   Allergen  Reactions   .  Risedronate Sodium  Other (See Comments)     Severe headache   .  Penicillins  Other (See Comments)   .  Pollen Extract  Other (See Comments)    Review of Systems - Other system negative  Blood pressure 158/63, pulse 37, resp. rate 16, height 6' (1.829 m), weight 68.04 kg (150 lb), SpO2 96.00%.  General appearance: alert, cooperative, appears stated age and  frail  Eyes: FROM  Neck: no adenopathy, no carotid bruit, no JVD, supple, symmetrical, trachea midline and thyroid not enlarged, symmetric, no tenderness/mass/nodules  Neck: JVP - normal, carotids 2+= without bruits  Resp: clear to auscultation bilaterally  Chest wall: no tenderness  Cardio: regular rate and rhythm, S1, S2 normal, no murmur, click, rub or gallop and distant heart sounds  GI: soft, non-tender; bowel sounds normal; no masses, no organomegaly  Extremities: extremities normal, atraumatic, no cyanosis or edema  Pulses: Carotids were 2+, femorals were 2+, no bruit. Pedal pulses were 1+ bilateral  Skin: Skin color, texture, turgor normal. No rashes or lesions  Results for orders placed during the hospital encounter of 12/03/12 (from the past 48 hour(s))   GLUCOSE, CAPILLARY Status: Abnormal    Collection Time    12/03/12 9:05 PM   Result  Value  Range    Glucose-Capillary  136 (*)  70 - 99 mg/dL   POCT I-STAT TROPONIN I Status: None    Collection Time    12/03/12 9:07 PM   Result  Value  Range    Troponin i, poc  0.03  0.00 - 0.08 ng/mL    Comment 3      Comment:  Due to the  release kinetics of cTnI,     a negative result within the first hours     of the onset of symptoms does not rule out     myocardial infarction with certainty.     If myocardial infarction is still suspected,     repeat the test at appropriate intervals.   CBC WITH DIFFERENTIAL Status: Abnormal    Collection Time    12/03/12 9:12 PM   Result  Value  Range    WBC  13.5 (*)  4.0 - 10.5 K/uL    RBC  4.30  4.22 - 5.81 MIL/uL    Hemoglobin  15.1  13.0 - 17.0 g/dL    HCT  16.1  09.6 - 04.5 %    MCV  96.0  78.0 - 100.0 fL    MCH  35.1 (*)  26.0 - 34.0 pg    MCHC  36.6 (*)  30.0 - 36.0 g/dL    RDW  40.9  81.1 - 91.4 %    Platelets  262  150 - 400 K/uL    Neutrophils Relative %  86 (*)  43 - 77 %    Neutro Abs  11.5 (*)  1.7 - 7.7 K/uL    Lymphocytes Relative  5 (*)  12 - 46 %    Lymphs Abs  0.7  0.7  - 4.0 K/uL    Monocytes Relative  8  3 - 12 %    Monocytes Absolute  1.1 (*)  0.1 - 1.0 K/uL    Eosinophils Relative  0  0 - 5 %    Eosinophils Absolute  0.1  0.0 - 0.7 K/uL    Basophils Relative  0  0 - 1 %    Basophils Absolute  0.0  0.0 - 0.1 K/uL   COMPREHENSIVE METABOLIC PANEL Status: Abnormal    Collection Time    12/03/12 9:12 PM   Result  Value  Range    Sodium  140  135 - 145 mEq/L    Potassium  3.7  3.5 - 5.1 mEq/L    Chloride  99  96 - 112 mEq/L    CO2  30  19 - 32 mEq/L    Glucose, Bld  155 (*)  70 - 99 mg/dL    BUN  27 (*)  6 - 23 mg/dL    Creatinine, Ser  7.82  0.50 - 1.35 mg/dL    Calcium  95.6  8.4 - 10.5 mg/dL    Total Protein  7.1  6.0 - 8.3 g/dL    Albumin  3.4 (*)  3.5 - 5.2 g/dL    AST  18  0 - 37 U/L    ALT  11  0 - 53 U/L    Alkaline Phosphatase  126 (*)  39 - 117 U/L    Total Bilirubin  0.3  0.3 - 1.2 mg/dL    GFR calc non Af Amer  57 (*)  >90 mL/min    GFR calc Af Amer  66 (*)  >90 mL/min    Comment:  (NOTE)     The eGFR has been calculated using the CKD EPI equation.     This calculation has not been validated in all clinical situations.     eGFR's persistently <90 mL/min signify possible Chronic Kidney     Disease.   MAGNESIUM Status: None    Collection Time    12/03/12 9:12 PM   Result  Value  Range  Magnesium  1.8  1.5 - 2.5 mg/dL   PROTIME-INR Status: None    Collection Time    12/03/12 9:12 PM   Result  Value  Range    Prothrombin Time  12.7  11.6 - 15.2 seconds    INR  0.97  0.00 - 1.49   APTT Status: Abnormal    Collection Time    12/03/12 9:12 PM   Result  Value  Range    aPTT  38 (*)  24 - 37 seconds    Comment:      IF BASELINE aPTT IS ELEVATED,     SUGGEST PATIENT RISK ASSESSMENT     BE USED TO DETERMINE APPROPRIATE     ANTICOAGULANT THERAPY.   POCT I-STAT TROPONIN I Status: None    Collection Time    12/03/12 9:59 PM   Result  Value  Range    Troponin i, poc  0.04  0.00 - 0.08 ng/mL    Comment 3      Comment:  Due to  the release kinetics of cTnI,     a negative result within the first hours     of the onset of symptoms does not rule out     myocardial infarction with certainty.     If myocardial infarction is still suspected,     repeat the test at appropriate intervals.   POCT I-STAT, CHEM 8 Status: Abnormal    Collection Time    12/03/12 10:01 PM   Result  Value  Range    Sodium  139  135 - 145 mEq/L    Potassium  3.8  3.5 - 5.1 mEq/L    Chloride  101  96 - 112 mEq/L    BUN  27 (*)  6 - 23 mg/dL    Creatinine, Ser  4.09  0.50 - 1.35 mg/dL    Glucose, Bld  811 (*)  70 - 99 mg/dL    Calcium, Ion  9.14  1.13 - 1.30 mmol/L    TCO2  31  0 - 100 mmol/L    Hemoglobin  14.6  13.0 - 17.0 g/dL    HCT  78.2  95.6 - 21.3 %   CG4 I-STAT (LACTIC ACID) Status: None    Collection Time    12/03/12 10:02 PM   Result  Value  Range    Lactic Acid, Venous  1.12  0.5 - 2.2 mmol/L    Ct Head Wo Contrast  12/03/2012 *RADIOLOGY REPORT* Clinical Data: Abrasion, fall CT HEAD WITHOUT CONTRAST CT CERVICAL SPINE WITHOUT CONTRAST Technique: Multidetector CT imaging of the head and cervical spine was performed following the standard protocol without intravenous contrast. Multiplanar CT image reconstructions of the cervical spine were also generated. Comparison: Head CT 02/25/2011 CT HEAD Findings: No acute intracranial hemorrhage. No focal mass lesion. No CT evidence of acute infarction. No midline shift or mass effect. No hydrocephalus. Basilar cisterns are patent. There is extensive ventricular dilatation which is increased from comparison exam. There is dilatation of the lateral ventricles which extended to the temporal horns. Third ventricle and fourth ventricle are dilated. These findings again are similar but increased compared to CT 02/25/2011. There is generalized cortical atrophy. Paranasal sinuses and mastoid air cells are clear. Orbits are normal. IMPRESSION: 1. No evidence of intracranial trauma. 2. Marked  ventriculomegaly which is present on CT of 02/25/2011 but progressively worsened in the interval. No obstructing lesion identified. CT CERVICAL SPINE Findings: No prevertebral soft tissue swelling. No acute  loss vertebral body height and there is multiple levels of endplate osteophytosis and disc space narrowing. Normal facet to dilation. Normal craniocervical junction. No evidence epidural paraspinal hematoma. IMPRESSION: 1. No evidence cervical spine fracture. 2. Multilevel disc osteophytic disease. Original Report Authenticated By: Genevive Bi, M.D.  Ct Cervical Spine Wo Contrast  12/03/2012 *RADIOLOGY REPORT* Clinical Data: Abrasion, fall CT HEAD WITHOUT CONTRAST CT CERVICAL SPINE WITHOUT CONTRAST Technique: Multidetector CT imaging of the head and cervical spine was performed following the standard protocol without intravenous contrast. Multiplanar CT image reconstructions of the cervical spine were also generated. Comparison: Head CT 02/25/2011 CT HEAD Findings: No acute intracranial hemorrhage. No focal mass lesion. No CT evidence of acute infarction. No midline shift or mass effect. No hydrocephalus. Basilar cisterns are patent. There is extensive ventricular dilatation which is increased from comparison exam. There is dilatation of the lateral ventricles which extended to the temporal horns. Third ventricle and fourth ventricle are dilated. These findings again are similar but increased compared to CT 02/25/2011. There is generalized cortical atrophy. Paranasal sinuses and mastoid air cells are clear. Orbits are normal. IMPRESSION: 1. No evidence of intracranial trauma. 2. Marked ventriculomegaly which is present on CT of 02/25/2011 but progressively worsened in the interval. No obstructing lesion identified. CT CERVICAL SPINE Findings: No prevertebral soft tissue swelling. No acute loss vertebral body height and there is multiple levels of endplate osteophytosis and disc space narrowing. Normal facet  to dilation. Normal craniocervical junction. No evidence epidural paraspinal hematoma. IMPRESSION: 1. No evidence cervical spine fracture. 2. Multilevel disc osteophytic disease. Original Report Authenticated By: Genevive Bi, M.D.  Dg Chest Portable 1 View  12/03/2012 *RADIOLOGY REPORT* Clinical Data: Loss of consciousness. PORTABLE CHEST - 1 VIEW Comparison: 02/25/2011 and 11/08/2010 Findings: Heart size and pulmonary vascularity are normal. The patient has chronic interstitial lung disease as at the bases, possibly representing pulmonary fibrosis. No acute infiltrates or effusions. No acute osseous abnormality. Old healed fracture of the right clavicle. IMPRESSION: No acute abnormalities. Chronic interstitial lung disease. Original Report Authenticated By: Francene Boyers, M.D.   Labs:  Lab Results   Component  Value  Date    WBC  13.5*  12/03/2012    HGB  14.6  12/03/2012    HCT  43.0  12/03/2012    MCV  96.0  12/03/2012    PLT  262  12/03/2012    Recent Labs  Lab  12/03/12 2112  12/03/12 2201   NA  140  139   K  3.7  3.8   CL  99  101   CO2  30  --   BUN  27*  27*   CREATININE  1.13  1.30   CALCIUM  10.0  --   PROT  7.1  --   BILITOT  0.3  --   ALKPHOS  126*  --   ALT  11  --   AST  18  --   GLUCOSE  155*  154*    Lab Results   Component  Value  Date    CKTOTAL  34  11/09/2010    CKMB  2.8  11/09/2010    TROPONINI  <0.30  11/08/2010    EKG: 12/03/2012: Complete heart block with ventricular escape at a rate of 35 beats a minute..  Assessment/Plan  1. Syncope secondary to complete heart block  2. Hypertension  3. History of pulmonary fibrosis  4. History of stroke in the remote past with residual right-sided weakness  5. Gait instability  6. torsades secondary to #1. This should be taking care of by permanent pacemaker insertion. Recommendation: I have discussed the treatment options with the patient, his primary cardiologist, and his family. Permanent pacemaker insertion is  indicated. The risk, goals, benefits, and expectations of the procedure have been discussed with the patient and he wishes to proceed.   Lewayne Bunting, M.D.

## 2012-12-04 NOTE — CV Procedure (Signed)
DDD PM insertion via the left subclavian vein without immediate complication. [id number].

## 2012-12-05 ENCOUNTER — Inpatient Hospital Stay (HOSPITAL_COMMUNITY): Payer: Medicare Other

## 2012-12-05 LAB — BASIC METABOLIC PANEL
Calcium: 8.8 mg/dL (ref 8.4–10.5)
Creatinine, Ser: 1.07 mg/dL (ref 0.50–1.35)
GFR calc Af Amer: 71 mL/min — ABNORMAL LOW (ref >90)
GFR calc non Af Amer: 61 mL/min — ABNORMAL LOW (ref >90)
Sodium: 139 mEq/L (ref 135–145)

## 2012-12-05 MED ORDER — ACETAMINOPHEN 325 MG PO TABS: 650.0000 mg | ORAL_TABLET | ORAL | Status: DC | PRN

## 2012-12-05 MED ORDER — METOPROLOL TARTRATE 50 MG PO TABS
50.0000 mg | ORAL_TABLET | Freq: Two times a day (BID) | ORAL | Status: DC
  Administered 2012-12-05 – 2012-12-06 (×3): 50 mg via ORAL
  Filled 2012-12-05 (×5): qty 1

## 2012-12-05 NOTE — Progress Notes (Addendum)
Patient ID: [name] Holmes, male   DOB: [birth year], 79 y.o.   MRN: [id number] Subjective:  No chest pain or sob.   Objective:  Vital Signs in the last 24 hours: Temp:  [97.8 F (36.6 C)-98.9 F (37.2 C)] 98.9 F (37.2 C) (08/20 0400) Pulse Rate:  [35-80] 69 (08/20 0600) Resp:  [12-24] 12 (08/20 0600) BP: (128-188)/(43-103) 155/78 mmHg (08/20 0600) SpO2:  [93 %-99 %] 97 % (08/20 0600)  Intake/Output from previous day: 08/19 0701 - 08/20 0700 In: 2162.5 [P.O.:600; I.V.:1262.5; IV Piggyback:300] Out: 2990 [Urine:2990] Intake/Output from this shift:    Physical Exam: Elderly appearing NAD HEENT: Unremarkable Neck:  7 cm JVD, no thyromegally Back:  No CVA tenderness Lungs:  Clear except for basilar rales. Incision without hematoma HEART:  Regular rate rhythm, no murmurs, no rubs, no clicks Abd:  Flat, positive bowel sounds, no organomegally, no rebound, no guarding Ext:  2 plus pulses, no edema, no cyanosis, no clubbing Skin:  No rashes no nodules Neuro:  CN II through XII intact, motor grossly intact  Lab Results:  Recent Labs  12/03/12 2112 12/03/12 2201 12/04/12 0530  WBC 13.5*  --  12.0*  HGB 15.1 14.6 14.6  PLT 262  --  281    Recent Labs  12/04/12 0530 12/04/12 0805  NA 140 137  K 3.2* 3.2*  CL 102 100  CO2 26 24  GLUCOSE 159* 152*  BUN 25* 24*  CREATININE 1.10 1.14    Recent Labs  12/04/12 0805  TROPONINI <0.30   Hepatic Function Panel  Recent Labs  12/04/12 0805  PROT 6.2  ALBUMIN 3.1*  AST 17  ALT 11  ALKPHOS 113  BILITOT 0.4   No results found for this basename: CHOL,  in the last 72 hours No results found for this basename: PROTIME,  in the last 72 hours  Imaging: Ct Head Wo Contrast  12/03/2012   *RADIOLOGY REPORT*  Clinical Data:  Abrasion, fall  CT HEAD WITHOUT CONTRAST CT CERVICAL SPINE WITHOUT CONTRAST  Technique:  Multidetector CT imaging of the head and cervical spine was performed following the standard protocol without  intravenous contrast.  Multiplanar CT image reconstructions of the cervical spine were also generated.  Comparison:  Head CT 02/25/2011  CT HEAD  Findings: No acute intracranial hemorrhage.  No focal mass lesion. No CT evidence of acute infarction.   No midline shift or mass effect.  No hydrocephalus.  Basilar cisterns are patent.  There is extensive ventricular dilatation which is increased from comparison exam.  There is  dilatation of the lateral ventricles which extended to the temporal horns.  Third ventricle  and fourth ventricle are dilated.  These findings again are similar but increased compared to CT 02/25/2011.  There is generalized cortical atrophy.  Paranasal sinuses and mastoid air cells are clear.  Orbits are normal.  IMPRESSION:  1.  No evidence of intracranial trauma. 2.  Marked ventriculomegaly which is present on CT of 02/25/2011 but progressively worsened in the interval.  No obstructing lesion identified.  CT CERVICAL SPINE  Findings: No prevertebral soft tissue swelling.  No acute loss vertebral body height and there is multiple levels of endplate osteophytosis and disc space narrowing.  Normal facet to dilation. Normal craniocervical junction.  No evidence epidural paraspinal hematoma.  IMPRESSION: 1.  No evidence cervical spine fracture.  2.  Multilevel disc osteophytic disease.   Original Report Authenticated By: Genevive Bi, M.D.   Ct Cervical Spine Wo Contrast  12/03/2012   *RADIOLOGY REPORT*  Clinical Data:  Abrasion, fall  CT HEAD WITHOUT CONTRAST CT CERVICAL SPINE WITHOUT CONTRAST  Technique:  Multidetector CT imaging of the head and cervical spine was performed following the standard protocol without intravenous contrast.  Multiplanar CT image reconstructions of the cervical spine were also generated.  Comparison:  Head CT 02/25/2011  CT HEAD  Findings: No acute intracranial hemorrhage.  No focal mass lesion. No CT evidence of acute infarction.   No midline shift or mass effect.   No hydrocephalus.  Basilar cisterns are patent.  There is extensive ventricular dilatation which is increased from comparison exam.  There is  dilatation of the lateral ventricles which extended to the temporal horns.  Third ventricle  and fourth ventricle are dilated.  These findings again are similar but increased compared to CT 02/25/2011.  There is generalized cortical atrophy.  Paranasal sinuses and mastoid air cells are clear.  Orbits are normal.  IMPRESSION:  1.  No evidence of intracranial trauma. 2.  Marked ventriculomegaly which is present on CT of 02/25/2011 but progressively worsened in the interval.  No obstructing lesion identified.  CT CERVICAL SPINE  Findings: No prevertebral soft tissue swelling.  No acute loss vertebral body height and there is multiple levels of endplate osteophytosis and disc space narrowing.  Normal facet to dilation. Normal craniocervical junction.  No evidence epidural paraspinal hematoma.  IMPRESSION: 1.  No evidence cervical spine fracture.  2.  Multilevel disc osteophytic disease.   Original Report Authenticated By: Genevive Bi, M.D.   Dg Chest Portable 1 View  12/03/2012   *RADIOLOGY REPORT*  Clinical Data: Loss of consciousness.  PORTABLE CHEST - 1 VIEW  Comparison: 02/25/2011 and 11/08/2010  Findings: Heart size and pulmonary vascularity are normal.  The patient has chronic interstitial lung disease as at the bases, possibly representing pulmonary fibrosis.  No acute infiltrates or effusions.  No acute osseous abnormality.  Old healed fracture of the right clavicle.  IMPRESSION: No acute abnormalities.  Chronic interstitial lung disease.   Original Report Authenticated By: Francene Boyers, M.D.    Cardiac Studies: Tele - nsr with chb and ventricular pacing Assessment/Plan:  1. CHB - s/p PPM. From my perspective, ok for discharge home. He will need potassium repletion. Consider beta blocker. He had some far field over sensing on the atrial lead from the  ventricle which has been taken care of by reprogramming the device sensitivity, done under my direct supervision.  LOS: 2 days    Nickolas Chalfin,M.D. 12/05/2012, 8:02 AM

## 2012-12-05 NOTE — Progress Notes (Signed)
Pt unable to void despite trying multiple times. Pt bladder scanned and showed . NP,  K. Schorr notified. Order for In&Out cath obtained. However, pt had incontinent episode when entering the room to insert In&Out cath. Will continue to monitor.

## 2012-12-05 NOTE — Progress Notes (Signed)
Clinical Social Work Department CLINICAL SOCIAL WORK PLACEMENT NOTE 12/05/2012  Patient:  [name] Holmes, [name] Holmes  Account Number:  [id number] Admit date:  12/03/2012  Clinical Social Worker:  Sharol Harness, Theresia Majors  Date/time:  12/05/2012 03:00 PM  Clinical Social Work is seeking post-discharge placement for this patient at the following level of care:   SKILLED NURSING   (*CSW will update this form in Epic as items are completed)   12/05/2012  Patient/family provided with [facility] Department of Clinical Social Work's list of facilities offering this level of care within the geographic area requested by the patient (or if unable, by the patient's family).  12/05/2012  Patient/family informed of their freedom to choose among providers that offer the needed level of care, that participate in Medicare, Medicaid or managed care program needed by the patient, have an available bed and are willing to accept the patient.  12/05/2012  Patient/family informed of [facility]' ownership interest in [facility], as well as of the fact that they are under no obligation to receive care at this facility.  PASARR submitted to EDS on 12/05/2012 PASARR number received from EDS on 12/05/2012  FL2 transmitted to all facilities in geographic area requested by pt/family on  12/05/2012 FL2 transmitted to all facilities within larger geographic area on   Patient informed that his/her managed care company has contracts with or will negotiate with  certain facilities, including the following:     Patient/family informed of bed offers received:   Patient chooses bed at  Physician recommends and patient chooses bed at    Patient to be transferred to  on   Patient to be transferred to facility by   The following physician request were entered in Epic:   Additional CommentsSharol Harness, LCSWA [phone]

## 2012-12-05 NOTE — Progress Notes (Signed)
Subjective:  Patient is presently doing well and has not had any further syncope in the hospital.  No further episodes of ventricular tachycardia or TDP.  Patient underwent pacemaker implantation yesterday without any complications.  Objective:  Vital Signs in the last 24 hours: Temp:  [97.7 F (36.5 C)-98.9 F (37.2 C)] 97.7 F (36.5 C) (08/20 0800) Pulse Rate:  [35-88] 88 (08/20 0800) Resp:  [12-24] 19 (08/20 0800) BP: (128-191)/(48-103) 191/74 mmHg (08/20 0800) SpO2:  [93 %-99 %] 97 % (08/20 0800)  Intake/Output from previous day: 08/19 0701 - 08/20 0700 In: 2162.5 [P.O.:600; I.V.:1262.5; IV Piggyback:300] Out: 2990 [Urine:2990]  Physical Exam:  General appearance: alert, cooperative, appears stated age and frail  Eyes: FROM  Neck: no adenopathy, no carotid bruit, no JVD, supple, symmetrical, trachea midline and thyroid not enlarged, symmetric, no tenderness/mass/nodules  Neck: JVP - normal, carotids 2+= without bruits  Resp: clear to auscultation bilaterally  Chest wall: no tenderness. Mild crepitus at the pacer site without hematoma.  Cardio: regular rate and rhythm, S1, S2 normal, no murmur, click, rub or gallop and distant heart sounds  GI: soft, non-tender; bowel sounds normal; no masses, no organomegaly  Extremities: extremities normal, atraumatic, no cyanosis or edema  Pulses: Carotids were 2+, femorals were 2+, no bruit. Pedal pulses were 1+ bilateral  Lab Results:  Recent Labs  12/03/12 2112 12/03/12 2201 12/04/12 0530  WBC 13.5*  --  12.0*  HGB 15.1 14.6 14.6  PLT 262  --  281    Recent Labs  12/04/12 0805 12/05/12 0732  NA 137 139  K 3.2* 4.2  CL 100 103  CO2 24 24  GLUCOSE 152* 94  BUN 24* 18  CREATININE 1.14 1.07    Recent Labs  12/04/12 0805  TROPONINI <0.30   Hepatic Function Panel  Recent Labs  12/04/12 0805  PROT 6.2  ALBUMIN 3.1*  AST 17  ALT 11  ALKPHOS 113  BILITOT 0.4   Assessment/Plan:  1.  Complete heart block  complicated by TDP.  Status post [employer] Jude  Assurity dual-chamber pacemaker, serial number [id number] implantation on 8/19/2014by Dr. Sharrell Ku 2.  Failure to thrive 3.  Syncope due to complete heart block 4.  Hypertension 5.  History of seizure disorder on chronic Dilantin therapy. 6.  Remote stroke without any recurrence.  Patient was on Persantine, which was discontinued during this hospital admission.  I would recommend just aspirin 81 mg by mouth daily.  Recommendation: From cardiac standpoint he is stable, however patient is definitely not safe to be discharged home.  He will need at least ECF placement to evaluate his safety and also his gait.  I have requested [facility] to take over his care and coordinate his discharge and also follow-up and evaluate failure to thrive.  On admission to the hospital, his urine did show ketones, probably due to dehydration, he was well hydrated now.  I will discontinue his IV fluids.  He can be transferred off to a telemetry.  I would like to see him back in about one month for follow-up of syncope, complete heart block patient is presently AV paced.    Pamella Pert, M.D. 12/05/2012, 12:00 PM [facility] Pager: [phone] Office: [phone] If no answer: [phone]

## 2012-12-05 NOTE — Progress Notes (Signed)
Clinical Social Work Department BRIEF PSYCHOSOCIAL ASSESSMENT 12/05/2012  Patient:  [name] Holmes, [name] Holmes     Account Number:  [id number]     Admit date:  12/03/2012  Clinical Social Worker:  Harless Nakayama  Date/Time:  12/05/2012 02:45 PM  Referred by:  Physician  Date Referred:  12/05/2012 Referred for  SNF Placement   Other Referral:   Interview type:  Family Other interview type:   CSW spoke with pt niece over the phone    PSYCHOSOCIAL DATA Living Status:  ALONE Admitted from facility:   Level of care:   Primary support name:  Felipa Eth [phone] Primary support relationship to patient:  FAMILY Degree of support available:   Pt has good support from two nieces. Diane Czornij and Juliet Rude Noppen [phone]    CURRENT CONCERNS Current Concerns  Post-Acute Placement   Other Concerns:    SOCIAL WORK ASSESSMENT / PLAN CSW informed by CSW on 2900 that pt is a tx and nieces are requesting SNF. CSW spoke with pt niece, Sedalia Muta, who informed CSW pt has been at [facility] in the past and they believe he will need ST rehab at dc and this would be a good fit for him. Pt niece reported that pt nurse informed her that PT/OT have been ordered and will make their recommendations. CSW informed pt niece that SNF search can be initiated today if desired and dc plan could be adjusted if needed after PT/OT make recommendations. Pt niece agreeable to pt being referred out to SNFs in [facility]. First choice will be for [facility] and second choice for [facility].    CSW will send clinicals to [facility] once PT/OT have done evals.   Assessment/plan status:  Psychosocial Support/Ongoing Assessment of Needs Other assessment/ plan:   Information/referral to community resources:   None needed at this time    PATIENT'S/FAMILY'S RESPONSE TO PLAN OF CARE: Pt family agreeable to SNF search.       Carlis Blanchard, LCSWA [phone]

## 2012-12-05 NOTE — Evaluation (Signed)
Physical Therapy Evaluation Patient Details Name: [name] Holmes MRN: [id number] DOB: 03-Oct-1924 Today's Date: 12/05/2012 Time: 4098-1191 PT Time Calculation (min): 25 min  PT Assessment / Plan / Recommendation History of Present Illness  79 y.o. male admitted to [facility] on 12/03/12 with syncopal episode found to be in complete heart block.  He is now s/p pacemaker on 12/04/12  Clinical Impression  [name] is very immobile, weak and at very high risk for falls.  He does not have 24 hour assist at home and reports he has only been eating one meal a day because he is unable to prepare food for himself (he has a friend who comes by once a day to check on him M-Fri).  I am not sure what he does for food on the weekend when his friend does not come.  He will need SNF for rehab to get stronger before returning home.      PT Assessment  Patient needs continued PT services    Follow Up Recommendations  SNF    Does the patient have the potential to tolerate intense rehabilitation     NA  Barriers to Discharge Decreased caregiver support Pt has intermittent help, needs 24/7    Equipment Recommendations  None recommended by PT    Recommendations for Other Services   None  Frequency Min 3X/week    Precautions / Restrictions Precautions Precautions: ICD/Pacemaker;Fall Precaution Comments: h/o falls at home Required Braces or Orthoses: Sling   Pertinent Vitals/Pain See vitals flow sheet.      Mobility  Bed Mobility Bed Mobility: Supine to Sit;Sitting - Scoot to Edge of Bed Supine to Sit: 3: Mod assist;HOB elevated Sitting - Scoot to Edge of Bed: 4: Min assist Details for Bed Mobility Assistance: mod assist to pull to sitting using right arm.  Railing used to pull to scoot to EOB.  Min/mod assist to prevent posterior LOB during transitions.   Transfers Transfers: Sit to Stand;Stand to Sit Sit to Stand: 1: +2 Total assist;Without upper extremity assist;From bed Sit to Stand: Patient  Percentage: 60% Stand to Sit: 1: +2 Total assist;With upper extremity assist;To chair/3-in-1;With armrests Stand to Sit: Patient Percentage: 60% Stand Pivot Transfers: 1: +2 Total assist;From elevated surface;With armrests Stand Pivot Transfers: Patient Percentage: 60% Details for Transfer Assistance: assist needed to steady pt for balance.  Heavy reliance on legs resting on bed for support.  Pt with posterior lean in standing.   Ambulation/Gait Ambulation/Gait Assistance: Not tested (comment) (two person assist needed and RW to be safe)        PT Diagnosis: Difficulty walking;Abnormality of gait;Generalized weakness;Acute pain  PT Problem List: Decreased strength;Decreased activity tolerance;Decreased balance;Decreased coordination;Decreased mobility;Pain;Decreased knowledge of precautions PT Treatment Interventions: DME instruction;Gait training;Functional mobility training;Therapeutic activities;Therapeutic exercise;Balance training;Neuromuscular re-education;Cognitive remediation;Patient/family education     PT Goals(Current goals can be found in the care plan section) Acute Rehab PT Goals Patient Stated Goal: to get stronger and go home, to stop falling PT Goal Formulation: With patient Time For Goal Achievement: 12/19/12 Potential to Achieve Goals: Good  Visit Information  Last PT Received On: 12/05/12 Assistance Needed: +2 History of Present Illness: 79 y.o. male admitted to [facility] on 12/03/12 with syncopal episode found to be in complete heart block.  He is now s/p pacemaker on 12/04/12       Prior Functioning  Home Living Family/patient expects to be discharged to:: Private residence Living Arrangements: Alone Available Help at Discharge: Friend(s);Personal care attendant;Available PRN/intermittently Type of Home: House  Home Access: Ramped entrance Home Layout: One level Home Equipment: Walker - 2 wheels Prior Function Level of Independence: Needs assistance Gait /  Transfers Assistance Needed: mod I with RW with h/o falls ADL's / Homemaking Assistance Needed: total assist for cooking and housework.   Communication / Swallowing Assistance Needed: pt stutters, takes extra time to get his words out.   Comments: Per pt he has been eating one meal a day because that is when his friend stops by and heats something up for him.  His housekeeper comes one time a week and shops for him, cleans, and cooks all the food she buys.   Communication Communication: Other (comment) (stutters, has difficulty getting his words out.  ) Dominant Hand: Right    Cognition  Cognition Arousal/Alertness: Awake/alert Behavior During Therapy: WFL for tasks assessed/performed Overall Cognitive Status: Within Functional Limits for tasks assessed (not specifically tested)    Extremity/Trunk Assessment Upper Extremity Assessment Upper Extremity Assessment: Generalized weakness Lower Extremity Assessment Lower Extremity Assessment: Generalized weakness   Balance Balance Balance Assessed: Yes Static Sitting Balance Static Sitting - Balance Support: Right upper extremity supported;Feet supported Static Sitting - Level of Assistance: 4: Min assist Static Standing Balance Static Standing - Balance Support: Right upper extremity supported Static Standing - Level of Assistance: 1: +2 Total assist;Patient percentage (comment) (60%) Dynamic Standing Balance Dynamic Standing - Balance Support: Right upper extremity supported Dynamic Standing - Level of Assistance: 1: +2 Total assist;Patient percentage (comment) (pt 60%)  End of Session PT - End of Session Equipment Utilized During Treatment: Other (comment) (L arm sling) Activity Tolerance: Patient limited by fatigue Patient left: in chair;with call bell/phone within reach Nurse Communication: Mobility status    Lurena Joiner B. Shuree Brossart, PT, DPT [phone]   12/05/2012, 4:58 PM

## 2012-12-05 NOTE — Op Note (Signed)
NAMEHERNANDEZ, LOSASSO NO.:  [id number]  MEDICAL RECORD NO.:  [id number]  LOCATION:  2H04C                        FACILITY:  [facility]  PHYSICIAN:  Doylene Canning. Ladona Ridgel, MD    DATE OF BIRTH:  09-28-1924  DATE OF PROCEDURE:  12/04/2012 DATE OF DISCHARGE:                              OPERATIVE REPORT   PROCEDURE PERFORMED:  Insertion of a dual-chamber pacemaker with indication of symptomatic complete heart block complicated by torsades.  INTRODUCTION:  The patient is an 79 year old man with multiple medical problems, who presented with syncopal episode as well as near syncope and was found to be in complete heart block.  While on telemetry in the ICU, the patient developed torsades secondary to PVCs in the setting of complete heart block.  He is on no medicines to reverse his problem and he is now referred for insertion of a dual-chamber pacemaker.  PROCEDURE:  After informed consent was obtained, the patient was taken to the Diagnostic EP Lab in the fasting state.  After usual preparation and draping, intravenous fentanyl and midazolam was given for sedation. A 30 mL of lidocaine was infiltrated into the left infraclavicular region.  A 5-cm incision was carried out over this region and electrocautery was utilized to dissect down the fascial plane.  The cephalic vein was initially dissected free, but was very small and could not be utilized for placement of the pacing leads.  The subclavian vein was then punctured x2 and the St. Jude, model 2088T 58-cm active fixation pacing lead, serial number [id number] was advanced into the right ventricle and the St. Jude, model 2088T 52-cm active fixation pacing lead, serial number [id number] was advanced to the right atrium. Mapping was first carried out in the right ventricle, and at the final site, the R-waves initially measured 6 millivolts.  The pacing impedance with the lead actively fixed was 800 ohms and threshold was initially  2 volts at 0.4 milliseconds.  There was a large injury current.  At this point, attention was then turned to placement of the atrial lead, which was placed in the anterolateral portion of the right atrium with the P- waves measured 2.5 millivolts and the pacing threshold was 0.9 volts at 0.4 milliseconds.  The pacing impedance was 400 ohms and there was a large injury current with active fixation of the lead.  With these satisfactory parameters, the leads were secured to the pectoralis fascia with a figure-of-eight silk suture.  The sewing sleeve was secured with silk suture.  Electrocautery was utilized to make a subcutaneous pocket. Antibiotic irrigation was utilized to irrigate the pocket and electrocautery was utilized to assure hemostasis.  Assuring, hemostasis was somewhat difficult in this patient.  At this point, the St. Jude Assurity dual-chamber pacemaker, serial number [id number] was connected to the atrial and the RV leads and placed back in the subcutaneous pocket. The pocket was irrigated with antibiotic irrigation.  The incision was closed with 2-0 and 3-0 Vicryl.  Benzoin and Steri-Strips were painted on the skin.  A pressure dressing was applied and the patient was returned to his room in satisfactory condition.  COMPLICATIONS:  There were no immediate procedure complications.  RESULTS:  This demonstrate successful implantation of a St. Jude dual- chamber pacemaker in a patient with symptomatic complete heart block.     Doylene Canning. Ladona Ridgel, MD     GWT/MEDQ  D:  12/04/2012  T:  12/05/2012  Job:  [id number]  cc:   Pamella Pert, MD

## 2012-12-06 DIAGNOSIS — R55 Syncope and collapse: Secondary | ICD-10-CM

## 2012-12-06 DIAGNOSIS — I442 Atrioventricular block, complete: Principal | ICD-10-CM

## 2012-12-06 DIAGNOSIS — R569 Unspecified convulsions: Secondary | ICD-10-CM

## 2012-12-06 LAB — CBC
HCT: 37.7 % — ABNORMAL LOW (ref 39.0–52.0)
MCHC: 36.3 g/dL — ABNORMAL HIGH (ref 30.0–36.0)
Platelets: 266 10*3/uL (ref 150–400)
RDW: 12.7 % (ref 11.5–15.5)
WBC: 10.7 10*3/uL — ABNORMAL HIGH (ref 4.0–10.5)

## 2012-12-06 LAB — BASIC METABOLIC PANEL
BUN: 24 mg/dL — ABNORMAL HIGH (ref 6–23)
GFR calc Af Amer: 69 mL/min — ABNORMAL LOW (ref >90)
GFR calc non Af Amer: 60 mL/min — ABNORMAL LOW (ref >90)
Potassium: 3.2 mEq/L — ABNORMAL LOW (ref 3.5–5.1)
Sodium: 139 mEq/L (ref 135–145)

## 2012-12-06 LAB — TSH: TSH: 2.897 u[IU]/mL (ref 0.350–4.500)

## 2012-12-06 MED ORDER — METOPROLOL TARTRATE 50 MG PO TABS: 50.0000 mg | ORAL_TABLET | Freq: Two times a day (BID) | ORAL | Status: DC

## 2012-12-06 MED ORDER — POTASSIUM CHLORIDE CRYS ER 20 MEQ PO TBCR
40.0000 meq | EXTENDED_RELEASE_TABLET | Freq: Once | ORAL | Status: AC
  Administered 2012-12-06: 40 meq via ORAL
  Filled 2012-12-06: qty 2

## 2012-12-06 MED ORDER — ASPIRIN 81 MG PO TBEC: 81.0000 mg | DELAYED_RELEASE_TABLET | Freq: Every day | ORAL | Status: DC

## 2012-12-06 MED ORDER — HYDRALAZINE HCL 50 MG PO TABS
50.0000 mg | ORAL_TABLET | Freq: Three times a day (TID) | ORAL | Status: DC
  Administered 2012-12-06: 50 mg via ORAL
  Filled 2012-12-06 (×3): qty 1

## 2012-12-06 MED ORDER — HYDRALAZINE HCL 50 MG PO TABS: 50.0000 mg | ORAL_TABLET | Freq: Three times a day (TID) | ORAL | Status: DC

## 2012-12-06 NOTE — Discharge Summary (Signed)
Physician Discharge Summary  [name] Holmes ZOX:[id number] DOB: 1924-04-22 DOA: 12/03/2012  PCP: Rayford Halsted, PT  Admit date: 12/03/2012 Discharge date: 12/06/2012  Recommendations for Outpatient Follow-up:  1. Pt will need to follow up with PCP in 2 weeks post discharge 2. Please obtain BMP to evaluate electrolytes and kidney function in one week 3. Please also check CBC to evaluate Hg and Hct levels   Discharge Diagnoses:  Active Problems:   * No active hospital problems. *  syncope -Secondary to complete heart block--admitted by Dr. Jeanella Cara -persantine discontined by Dr. Jacinto Halim -Patient had episodes of torsades after admission to ICU -12/04/2012 PPM placed by Dr. Lewayne Bunting -Patient had no further syncopal episodes during the hospitalization Hypertension -regimen adjusted by Dr. Jacinto Halim -HCTZ discontinued -Continue amlodipine 5 mg daily -Metoprolol tartrate 50 mg every 12 hours started -Hydralazine changed to 50 mg every 8 hours -BP continuing to improve -HCTZ was discontinued History of stroke -Persantine discontinued -Start on aspirin 81 mg daily Seizure disorder -Continue Dilantin home dose -Dilantin level 7.6 with albumin 3.1 -Has not had any seizures for several years -He will need outpatient neurology followup regarding continuation of Dilantin Deconditioning/gait instability -Physical therapy was consulted and recommended skilled nursing facility -Social work facilitated transfer to skilled nursing facility Hypokalemia -Repleted Discharge Condition: stable  Disposition:  Follow-up Information   Follow up with Pamella Pert, MD In 2 weeks.   Specialty:  Cardiology   Contact information:   [address] [place] [address] [phone]       Follow up with Lewayne Bunting, MD In 1 month.   Specialty:  Cardiology   Contact information:   [address] [place] [address] [phone]     SNF  Diet:heart  healthy Wt Readings from Last 3 Encounters:  12/04/12 68 kg (149 lb 14.6 oz)  12/04/12 68 kg (149 lb 14.6 oz)  10/04/12 69.854 kg (154 lb)     Hospital Course:  79 year old male with history of stroke, seizure disorder on Dilantin, hypertension, hyperlipidemia presents with syncope. Patient reports he was in the bathroom, and then does not remember what happened however woke up on the floor and called EMS. Per EMS he was helped up and as he was helped he had another  10 second episode of unresponsiveness where they report they were unable to obtain a radial pulse. EMS also questioned possible episode of V. tach on the monitor for 5 seconds after this episode. Patient received aspirin en route. The patient reports feeling much better on arrival to the emergency department, however has some continuing fatigue. Denies any preceding chest pain or shortness of breath prior to the syncopal episode. He has a seizure history however has not had a seizure in many years and is compliant with his Dilantin. Denies any tongue biting. Notes mild headache at this time and denies neck pain. Reports he is up-to-date on tetanus. Denies any history of prior cardiac disease, prior history of PE or DVT. EKG in the emergency department confirmed complete heart block.  Dr. Jacinto Halim was consulted regarding patient's complete heart block and will admit patient to the ICU with likely pacemaker needed.   Dr. Lewayne Bunting was consulted for [facility].    Consultants: cardiology  Discharge Exam: Filed Vitals:   12/06/12 0800  BP: 133/64  Pulse: 61  Temp: 99.7 F (37.6 C)  Resp: 18   Filed Vitals:   12/05/12 2236 12/06/12 0000 12/06/12 0400  12/06/12 0800  BP: 138/71 146/80 164/80 133/64  Pulse: 65 60 60 61  Temp:  98.6 F (37 C) 98.8 F (37.1 C) 99.7 F (37.6 C)  TempSrc:  Oral Oral Oral  Resp:  16 16 18   Height:      Weight:      SpO2:  94% 96% 94%   General: A&O x 3, NAD, pleasant, cooperative Cardiovascular: RRR,  no rub, no gallop, no S3 Respiratory: CTAB, no wheeze, no rhonchi Abdomen:soft, nontender, nondistended, positive bowel sounds Extremities: No edema, No lymphangitis, no petechiae  Discharge Instructions      Discharge Orders   Future Appointments Provider Department Dept Phone   10/10/2013 1:30 PM Ronal Fear, NP [facility] [phone]   Future Orders Complete By Expires   Diet - low sodium heart healthy  As directed    Increase activity slowly  As directed        Medication List    STOP taking these medications       dipyridamole 25 MG tablet  Commonly known as:  PERSANTINE     hydrochlorothiazide 25 MG tablet  Commonly known as:  HYDRODIURIL     potassium chloride 10 MEQ CR tablet  Commonly known as:  KLOR-CON      TAKE these medications       amLODipine 5 MG tablet  Commonly known as:  NORVASC  Take 5 mg by mouth daily.     aspirin 81 MG EC tablet  Take 1 tablet (81 mg total) by mouth daily.     Calcium-Vitamin D 600-200 MG-UNIT per tablet  Take 1 tablet by mouth 2 (two) times daily.     folic acid 1 MG tablet  Commonly known as:  FOLVITE  Take 1 mg by mouth every morning.     hydrALAZINE 50 MG tablet  Commonly known as:  APRESOLINE  Take 1 tablet (50 mg total) by mouth every 8 (eight) hours.     metoprolol 50 MG tablet  Commonly known as:  LOPRESSOR  Take 1 tablet (50 mg total) by mouth 2 (two) times daily.     phenytoin 100 MG ER capsule  Commonly known as:  DILANTIN  Take 1 capsule (100 mg total) by mouth 2 (two) times daily.     simvastatin 20 MG tablet  Commonly known as:  ZOCOR  Take 20 mg by mouth at bedtime.         The results of significant diagnostics from this hospitalization (including imaging, microbiology, ancillary and laboratory) are listed below for reference.    Significant Diagnostic Studies: Dg Chest 1 View  12/05/2012   *RADIOLOGY REPORT*  Clinical Data: Post pacemaker insertion  CHEST - 1 VIEW   Comparison: Portable chest x-ray of 12/03/2012  Findings: There is little change in aeration with basilar atelectasis remaining.  Cardiomegaly is stable.  A permanent pacemaker is now present, and no pneumothorax is seen.  IMPRESSION: Dual lead permanent pacemaker present.  Mild basilar atelectasis.   Original Report Authenticated By: Dwyane Dee, M.D.   Ct Head Wo Contrast  12/03/2012   *RADIOLOGY REPORT*  Clinical Data:  Abrasion, fall  CT HEAD WITHOUT CONTRAST CT CERVICAL SPINE WITHOUT CONTRAST  Technique:  Multidetector CT imaging of the head and cervical spine was performed following the standard protocol without intravenous contrast.  Multiplanar CT image reconstructions of the cervical spine were also generated.  Comparison:  Head CT 02/25/2011  CT HEAD  Findings: No acute intracranial hemorrhage.  No focal mass lesion. No CT evidence of acute infarction.   No midline shift or mass effect.  No hydrocephalus.  Basilar cisterns are patent.  There is extensive ventricular dilatation which is increased from comparison exam.  There is  dilatation of the lateral ventricles which extended to the temporal horns.  Third ventricle  and fourth ventricle are dilated.  These findings again are similar but increased compared to CT 02/25/2011.  There is generalized cortical atrophy.  Paranasal sinuses and mastoid air cells are clear.  Orbits are normal.  IMPRESSION:  1.  No evidence of intracranial trauma. 2.  Marked ventriculomegaly which is present on CT of 02/25/2011 but progressively worsened in the interval.  No obstructing lesion identified.  CT CERVICAL SPINE  Findings: No prevertebral soft tissue swelling.  No acute loss vertebral body height and there is multiple levels of endplate osteophytosis and disc space narrowing.  Normal facet to dilation. Normal craniocervical junction.  No evidence epidural paraspinal hematoma.  IMPRESSION: 1.  No evidence cervical spine fracture.  2.  Multilevel disc osteophytic  disease.   Original Report Authenticated By: Genevive Bi, M.D.   Ct Cervical Spine Wo Contrast  12/03/2012   *RADIOLOGY REPORT*  Clinical Data:  Abrasion, fall  CT HEAD WITHOUT CONTRAST CT CERVICAL SPINE WITHOUT CONTRAST  Technique:  Multidetector CT imaging of the head and cervical spine was performed following the standard protocol without intravenous contrast.  Multiplanar CT image reconstructions of the cervical spine were also generated.  Comparison:  Head CT 02/25/2011  CT HEAD  Findings: No acute intracranial hemorrhage.  No focal mass lesion. No CT evidence of acute infarction.   No midline shift or mass effect.  No hydrocephalus.  Basilar cisterns are patent.  There is extensive ventricular dilatation which is increased from comparison exam.  There is  dilatation of the lateral ventricles which extended to the temporal horns.  Third ventricle  and fourth ventricle are dilated.  These findings again are similar but increased compared to CT 02/25/2011.  There is generalized cortical atrophy.  Paranasal sinuses and mastoid air cells are clear.  Orbits are normal.  IMPRESSION:  1.  No evidence of intracranial trauma. 2.  Marked ventriculomegaly which is present on CT of 02/25/2011 but progressively worsened in the interval.  No obstructing lesion identified.  CT CERVICAL SPINE  Findings: No prevertebral soft tissue swelling.  No acute loss vertebral body height and there is multiple levels of endplate osteophytosis and disc space narrowing.  Normal facet to dilation. Normal craniocervical junction.  No evidence epidural paraspinal hematoma.  IMPRESSION: 1.  No evidence cervical spine fracture.  2.  Multilevel disc osteophytic disease.   Original Report Authenticated By: Genevive Bi, M.D.   Dg Chest Portable 1 View  12/03/2012   *RADIOLOGY REPORT*  Clinical Data: Loss of consciousness.  PORTABLE CHEST - 1 VIEW  Comparison: 02/25/2011 and 11/08/2010  Findings: Heart size and pulmonary vascularity  are normal.  The patient has chronic interstitial lung disease as at the bases, possibly representing pulmonary fibrosis.  No acute infiltrates or effusions.  No acute osseous abnormality.  Old healed fracture of the right clavicle.  IMPRESSION: No acute abnormalities.  Chronic interstitial lung disease.   Original Report Authenticated By: Francene Boyers, M.D.     Microbiology: Recent Results (from the past 240 hour(s))  MRSA PCR SCREENING     Status: None   Collection Time    12/04/12 12:19 AM      Result Value  Range Status   MRSA by PCR NEGATIVE  NEGATIVE Final   Comment:            The GeneXpert MRSA Assay (FDA     approved for NASAL specimens     only), is one component of a     comprehensive MRSA colonization     surveillance program. It is not     intended to diagnose MRSA     infection nor to guide or     monitor treatment for     MRSA infections.     Labs: Basic Metabolic Panel:  Recent Labs Lab 12/03/12 2112 12/03/12 2201 12/04/12 0530 12/04/12 0805 12/05/12 0732 12/06/12 0730  NA 140 139 140 137 139 139  K 3.7 3.8 3.2* 3.2* 4.2 3.2*  CL 99 101 102 100 103 105  CO2 30  --  26 24 24 24   GLUCOSE 155* 154* 159* 152* 94 188*  BUN 27* 27* 25* 24* 18 24*  CREATININE 1.13 1.30 1.10 1.14 1.07 1.09  CALCIUM 10.0  --  9.3 9.4 8.8 8.8  MG 1.8  --   --  2.0  --   --    Liver Function Tests:  Recent Labs Lab 12/03/12 2112 12/04/12 0805  AST 18 17  ALT 11 11  ALKPHOS 126* 113  BILITOT 0.3 0.4  PROT 7.1 6.2  ALBUMIN 3.4* 3.1*   No results found for this basename: LIPASE, AMYLASE,  in the last 168 hours No results found for this basename: AMMONIA,  in the last 168 hours CBC:  Recent Labs Lab 12/03/12 2112 12/03/12 2201 12/04/12 0530 12/06/12 0730  WBC 13.5*  --  12.0* 10.7*  NEUTROABS 11.5*  --   --   --   HGB 15.1 14.6 14.6 13.7  HCT 41.3 43.0 39.0 37.7*  MCV 96.0  --  94.9 95.7  PLT 262  --  281 266   Cardiac Enzymes:  Recent Labs Lab  12/04/12 0805  CKTOTAL 44  CKMB 4.7*  TROPONINI <0.30   BNP: No components found with this basename: POCBNP,  CBG:  Recent Labs Lab 12/03/12 2105  GLUCAP 136*    Time coordinating discharge:  Greater than 30 minutes  Signed:  Veldon Wager, DO [facility] Pager: [phone] 12/06/2012, 1:12 PM

## 2012-12-06 NOTE — Evaluation (Signed)
Occupational Therapy Evaluation Patient Details Name: [name] Holmes MRN: [id number] DOB: 1924/05/16 Today's Date: 12/06/2012 Time: 6295-2841 OT Time Calculation (min): 18 min  OT Assessment / Plan / Recommendation History of present illness 79 y.o. male admitted to [facility] on 12/03/12 with syncopal episode found to be in complete heart block.  He is now s/p pacemaker on 12/04/12   Clinical Impression   Pt admitted with above.  Pt presenting with generalized weakness and balance deficits.  Recommending SNF and noted in chart that pt to d/c to SNF today.  All further OT needs can be met at next venue of care.    OT Assessment  All further OT needs can be met in the next venue of care    Follow Up Recommendations  SNF    Barriers to Discharge      Equipment Recommendations    TBD at next venue   Recommendations for Other Services    Frequency       Precautions / Restrictions Precautions Precautions: ICD/Pacemaker;Fall Precaution Comments: h/o falls at home   Pertinent Vitals/Pain See vitals    ADL  Grooming: Performed;Wash/dry hands;Wash/dry face;Teeth care Where Assessed - Grooming: Supported sitting Upper Body Bathing: Simulated;Minimal assistance Where Assessed - Upper Body Bathing: Supported sitting Lower Body Bathing: Simulated;Moderate assistance Where Assessed - Lower Body Bathing: Supported sitting Upper Body Dressing: Simulated;Minimal assistance Where Assessed - Upper Body Dressing: Supported sitting Lower Body Dressing: Simulated;Maximal assistance Where Assessed - Lower Body Dressing: Supported sitting ADL Comments: generally weak    OT Diagnosis:    OT Problem List:   OT Treatment Interventions:     OT Goals(Current goals can be found in the care plan section)    Visit Information  Last OT Received On: 12/06/12 Assistance Needed: +2 History of Present Illness: 79 y.o. male admitted to [facility] on 12/03/12 with syncopal episode found to be in complete heart  block.  He is now s/p pacemaker on 12/04/12       Prior Functioning     Home Living Family/patient expects to be discharged to:: Private residence Living Arrangements: Alone Available Help at Discharge: Friend(s);Personal care attendant;Available PRN/intermittently Type of Home: House Home Access: Ramped entrance Home Layout: One level Home Equipment: Walker - 2 wheels Prior Function Level of Independence: Needs assistance Gait / Transfers Assistance Needed: mod I with RW with h/o falls ADL's / Homemaking Assistance Needed: total assist for cooking and housework.   Communication Communication: Other (comment) (difficult to understand at times due to stutter) Dominant Hand: Right         Vision/Perception     Cognition  Cognition Arousal/Alertness: Awake/alert Behavior During Therapy: WFL for tasks assessed/performed Overall Cognitive Status: Within Functional Limits for tasks assessed    Extremity/Trunk Assessment Upper Extremity Assessment Upper Extremity Assessment: Generalized weakness     Mobility Bed Mobility Bed Mobility: Supine to Sit;Sitting - Scoot to Edge of Bed;Sit to Supine Supine to Sit: 3: Mod assist;HOB elevated;With rails Sitting - Scoot to Edge of Bed: 4: Min assist Sit to Supine: 3: Mod assist;HOB flat Details for Bed Mobility Assistance: Assist to support truncal transition OOB.     Exercise     Balance Static Sitting Balance Static Sitting - Balance Support: No upper extremity supported;Feet supported Static Sitting - Level of Assistance: 4: Min assist Static Sitting - Comment/# of Minutes: Assist to prevent posterior lean while sitting EOB for grooming task.   End of Session OT - End of Session Activity Tolerance: Patient tolerated  treatment well Patient left: in bed;with call bell/phone within reach  GO    12/06/2012 Cipriano Mile OTR/L Pager [phone] Office [phone]  Cipriano Mile 12/06/2012, 5:06 PM

## 2012-12-06 NOTE — Progress Notes (Addendum)
CSW received authorization from [facility]. CSW informed facility and they do have a bed available. CSW informed pt niece that she will need to contact [facility] and get paper work filled out so that we can proceed with dc for today. CSW will arrange transport when facility notifies that paperwork has been completed.  [facility] Medicare Auth # for Transport: [id number]  Sharol Harness, LCSWA [phone]

## 2012-12-06 NOTE — Progress Notes (Signed)
CSW notified by [facility] to arrange transport for 5:30pm. CSW called [facility] afterhours and arranged transport for requested time. CSW signing off.  Janashia Parco, LCSWA [phone]

## 2012-12-06 NOTE — Progress Notes (Signed)
CSW sent clinicals to [id number] for authorization for SNF and left message with representative to notify.  Collen Hostler, LCSWA [phone]

## 2012-12-06 NOTE — Progress Notes (Signed)
[facility] arrived to take pt to facility.

## 2013-01-01 ENCOUNTER — Encounter: Payer: Self-pay | Admitting: Internal Medicine

## 2013-01-01 ENCOUNTER — Telehealth: Payer: Self-pay | Admitting: Internal Medicine

## 2013-01-01 NOTE — Telephone Encounter (Signed)
12-14-12 lmm @ 344pm to set up pacer wound check with device next week/mt 01-01-13 sent letter to set up appt, needs 91 day fu with [facility]/mt

## 2013-01-09 ENCOUNTER — Telehealth: Payer: Self-pay | Admitting: Internal Medicine

## 2013-01-09 NOTE — Telephone Encounter (Signed)
12-14-12 lmm @ 344pm to set up pacer wound check with device/mt 01-01-13 sent letter to make appt/mt

## 2013-01-16 ENCOUNTER — Emergency Department (HOSPITAL_COMMUNITY): Payer: Medicare Other

## 2013-01-16 ENCOUNTER — Inpatient Hospital Stay (HOSPITAL_COMMUNITY)
Admission: EM | Admit: 2013-01-16 | Discharge: 2013-01-22 | DRG: 292 | Disposition: A | Discharge status: Skilled Nursing Facility | Payer: Medicare Other | Attending: Internal Medicine | Admitting: Internal Medicine

## 2013-01-16 ENCOUNTER — Encounter (HOSPITAL_COMMUNITY): Payer: Self-pay

## 2013-01-16 DIAGNOSIS — R609 Edema, unspecified: Secondary | ICD-10-CM | POA: Diagnosis present

## 2013-01-16 DIAGNOSIS — I69998 Other sequelae following unspecified cerebrovascular disease: Secondary | ICD-10-CM

## 2013-01-16 DIAGNOSIS — R404 Transient alteration of awareness: Secondary | ICD-10-CM | POA: Diagnosis present

## 2013-01-16 DIAGNOSIS — E785 Hyperlipidemia, unspecified: Secondary | ICD-10-CM | POA: Diagnosis present

## 2013-01-16 DIAGNOSIS — I4729 Other ventricular tachycardia: Secondary | ICD-10-CM | POA: Diagnosis present

## 2013-01-16 DIAGNOSIS — R29898 Other symptoms and signs involving the musculoskeletal system: Secondary | ICD-10-CM | POA: Diagnosis present

## 2013-01-16 DIAGNOSIS — R5381 Other malaise: Secondary | ICD-10-CM | POA: Diagnosis present

## 2013-01-16 DIAGNOSIS — Z950 Presence of cardiac pacemaker: Secondary | ICD-10-CM

## 2013-01-16 DIAGNOSIS — R569 Unspecified convulsions: Secondary | ICD-10-CM

## 2013-01-16 DIAGNOSIS — F329 Major depressive disorder, single episode, unspecified: Secondary | ICD-10-CM | POA: Diagnosis present

## 2013-01-16 DIAGNOSIS — R269 Unspecified abnormalities of gait and mobility: Secondary | ICD-10-CM | POA: Diagnosis present

## 2013-01-16 DIAGNOSIS — I472 Ventricular tachycardia, unspecified: Secondary | ICD-10-CM | POA: Diagnosis present

## 2013-01-16 DIAGNOSIS — I10 Essential (primary) hypertension: Secondary | ICD-10-CM

## 2013-01-16 DIAGNOSIS — I959 Hypotension, unspecified: Secondary | ICD-10-CM | POA: Diagnosis not present

## 2013-01-16 DIAGNOSIS — R45851 Suicidal ideations: Secondary | ICD-10-CM

## 2013-01-16 DIAGNOSIS — I5041 Acute combined systolic (congestive) and diastolic (congestive) heart failure: Principal | ICD-10-CM | POA: Diagnosis present

## 2013-01-16 DIAGNOSIS — I509 Heart failure, unspecified: Secondary | ICD-10-CM | POA: Diagnosis present

## 2013-01-16 DIAGNOSIS — G40909 Epilepsy, unspecified, not intractable, without status epilepticus: Secondary | ICD-10-CM | POA: Diagnosis present

## 2013-01-16 DIAGNOSIS — Z8546 Personal history of malignant neoplasm of prostate: Secondary | ICD-10-CM

## 2013-01-16 HISTORY — DX: Unspecified dementia, unspecified severity, without behavioral disturbance, psychotic disturbance, mood disturbance, and anxiety: F03.90

## 2013-01-16 HISTORY — DX: Heart failure, unspecified: I50.9

## 2013-01-16 HISTORY — DX: Depression, unspecified: F32.A

## 2013-01-16 HISTORY — DX: Major depressive disorder, single episode, unspecified: F32.9

## 2013-01-16 LAB — PRO B NATRIURETIC PEPTIDE: Pro B Natriuretic peptide (BNP): 7222 pg/mL — ABNORMAL HIGH (ref 0–450)

## 2013-01-16 LAB — COMPREHENSIVE METABOLIC PANEL
ALT: 12 U/L (ref 0–53)
Albumin: 3 g/dL — ABNORMAL LOW (ref 3.5–5.2)
Alkaline Phosphatase: 128 U/L — ABNORMAL HIGH (ref 39–117)
BUN: 21 mg/dL (ref 6–23)
Calcium: 9 mg/dL (ref 8.4–10.5)
Chloride: 101 mEq/L (ref 96–112)
Potassium: 3.9 mEq/L (ref 3.5–5.1)
Sodium: 137 mEq/L (ref 135–145)
Total Bilirubin: 0.2 mg/dL — ABNORMAL LOW (ref 0.3–1.2)
Total Protein: 6.4 g/dL (ref 6.0–8.3)

## 2013-01-16 LAB — CBC
HCT: 40.4 % (ref 39.0–52.0)
MCHC: 34.4 g/dL (ref 30.0–36.0)
Platelets: 257 10*3/uL (ref 150–400)
RDW: 13.3 % (ref 11.5–15.5)
WBC: 8.4 10*3/uL (ref 4.0–10.5)

## 2013-01-16 LAB — ETHANOL: Alcohol, Ethyl (B): <11 mg/dL (ref 0–11)

## 2013-01-16 LAB — ACETAMINOPHEN LEVEL: Acetaminophen (Tylenol), Serum: <15 ug/mL (ref 10–30)

## 2013-01-16 LAB — TROPONIN I: Troponin I: <0.3 ng/mL (ref <0.30)

## 2013-01-16 LAB — POCT I-STAT TROPONIN I

## 2013-01-16 MED ORDER — ALBUTEROL SULFATE (5 MG/ML) 0.5% IN NEBU
2.5000 mg | INHALATION_SOLUTION | Freq: Once | RESPIRATORY_TRACT | Status: AC
  Administered 2013-01-16: 2.5 mg via RESPIRATORY_TRACT
  Filled 2013-01-16: qty 0.5

## 2013-01-16 MED ORDER — ALBUTEROL SULFATE (5 MG/ML) 0.5% IN NEBU: 2.5000 mg | INHALATION_SOLUTION | Freq: Four times a day (QID) | RESPIRATORY_TRACT | Status: DC | PRN

## 2013-01-16 MED ORDER — HYDRALAZINE HCL 50 MG PO TABS
50.0000 mg | ORAL_TABLET | Freq: Three times a day (TID) | ORAL | Status: DC
Start: 2013-01-16 — End: 2013-01-20
  Administered 2013-01-17 – 2013-01-20 (×11): 50 mg via ORAL
  Filled 2013-01-16 (×15): qty 1

## 2013-01-16 MED ORDER — ESCITALOPRAM OXALATE 10 MG PO TABS
10.0000 mg | ORAL_TABLET | Freq: Every day | ORAL | Status: DC
  Administered 2013-01-17 – 2013-01-22 (×6): 10 mg via ORAL
  Filled 2013-01-16 (×6): qty 1

## 2013-01-16 MED ORDER — SIMVASTATIN 20 MG PO TABS
20.0000 mg | ORAL_TABLET | Freq: Every day | ORAL | Status: DC
  Administered 2013-01-17 – 2013-01-21 (×5): 20 mg via ORAL
  Filled 2013-01-16 (×6): qty 1

## 2013-01-16 MED ORDER — PHENYTOIN SODIUM EXTENDED 100 MG PO CAPS
100.0000 mg | ORAL_CAPSULE | Freq: Two times a day (BID) | ORAL | Status: DC
Start: 2013-01-16 — End: 2013-01-20
  Administered 2013-01-17 – 2013-01-20 (×8): 100 mg via ORAL
  Filled 2013-01-16 (×10): qty 1

## 2013-01-16 MED ORDER — SODIUM CHLORIDE 0.9 % IJ SOLN
3.0000 mL | Freq: Two times a day (BID) | INTRAMUSCULAR | Status: DC
  Administered 2013-01-17 – 2013-01-22 (×11): 3 mL via INTRAVENOUS

## 2013-01-16 MED ORDER — SODIUM CHLORIDE 0.9 % IJ SOLN: 3.0000 mL | INTRAMUSCULAR | Status: DC | PRN

## 2013-01-16 MED ORDER — FOLIC ACID 1 MG PO TABS
1.0000 mg | ORAL_TABLET | Freq: Every day | ORAL | Status: DC
  Administered 2013-01-17 – 2013-01-22 (×6): 1 mg via ORAL
  Filled 2013-01-16 (×6): qty 1

## 2013-01-16 MED ORDER — SENNOSIDES-DOCUSATE SODIUM 8.6-50 MG PO TABS
2.0000 | ORAL_TABLET | Freq: Every evening | ORAL | Status: DC | PRN
  Administered 2013-01-18: 22:00:00 2 via ORAL
  Filled 2013-01-16: qty 2

## 2013-01-16 MED ORDER — SODIUM CHLORIDE 0.9 % IV SOLN: 250.0000 mL | INTRAVENOUS | Status: DC | PRN

## 2013-01-16 MED ORDER — ONDANSETRON HCL 4 MG/2ML IJ SOLN: 4.0000 mg | Freq: Four times a day (QID) | INTRAMUSCULAR | Status: DC | PRN

## 2013-01-16 MED ORDER — CALCIUM CARBONATE-VITAMIN D 500-200 MG-UNIT PO TABS
1.0000 | ORAL_TABLET | Freq: Two times a day (BID) | ORAL | Status: DC
  Administered 2013-01-17 – 2013-01-22 (×10): 1 via ORAL
  Filled 2013-01-16 (×12): qty 1

## 2013-01-16 MED ORDER — FUROSEMIDE 10 MG/ML IJ SOLN
40.0000 mg | Freq: Once | INTRAMUSCULAR | Status: AC
  Administered 2013-01-16: 40 mg via INTRAVENOUS
  Filled 2013-01-16: qty 4

## 2013-01-16 MED ORDER — FUROSEMIDE 10 MG/ML IJ SOLN
40.0000 mg | Freq: Two times a day (BID) | INTRAMUSCULAR | Status: DC
  Administered 2013-01-17 – 2013-01-20 (×7): 40 mg via INTRAVENOUS
  Filled 2013-01-16 (×9): qty 4

## 2013-01-16 MED ORDER — ACETAMINOPHEN 325 MG PO TABS: 650.0000 mg | ORAL_TABLET | ORAL | Status: DC | PRN

## 2013-01-16 MED ORDER — ENOXAPARIN SODIUM 40 MG/0.4ML ~~LOC~~ SOLN
40.0000 mg | SUBCUTANEOUS | Status: DC
  Administered 2013-01-17 – 2013-01-21 (×6): 40 mg via SUBCUTANEOUS
  Filled 2013-01-16 (×8): qty 0.4

## 2013-01-16 MED ORDER — METOPROLOL TARTRATE 50 MG PO TABS
50.0000 mg | ORAL_TABLET | Freq: Two times a day (BID) | ORAL | Status: DC
  Administered 2013-01-17 – 2013-01-22 (×9): 50 mg via ORAL
  Filled 2013-01-16 (×14): qty 1

## 2013-01-16 MED ORDER — ASPIRIN EC 81 MG PO TBEC
81.0000 mg | DELAYED_RELEASE_TABLET | Freq: Every day | ORAL | Status: DC
  Administered 2013-01-17 – 2013-01-22 (×6): 81 mg via ORAL
  Filled 2013-01-16 (×6): qty 1

## 2013-01-16 NOTE — ED Notes (Signed)
Per [facility]- Pt resides at [facility] skilled.  FULL CODE. HCPOA niece. Made aware yesterday that he would not be going home. He would be remaining in skilled care.  Admitted to NP thoughts of SI not plans yet. HX of depression.

## 2013-01-16 NOTE — ED Notes (Signed)
MD at bedside. EDP at [facility] updated on pt current status

## 2013-01-16 NOTE — ED Notes (Signed)
MD at bedside. 

## 2013-01-16 NOTE — Progress Notes (Signed)
Called to get report on patient.  Told by Diplomatic Services operational officer the nurse for patient, Morrie Sheldon, was busy and will call me back later.

## 2013-01-16 NOTE — ED Notes (Addendum)
Family at bedside. UPDATED ON PT CURRENT STATUS

## 2013-01-16 NOTE — ED Notes (Signed)
Bed: [facility] Expected date:  Expected time:  Means of arrival:  Comments: 79yo suicidal

## 2013-01-16 NOTE — ED Notes (Signed)
Patient transported to X-ray 

## 2013-01-16 NOTE — H&P (Signed)
[facility] History and Physical  [name] Holmes ZOX:[id number] DOB: 1924/10/12 DOA: 01/16/2013   PCP: Rayford Halsted, PT   Chief Complaint: suicidal ideation  HPI:  79 year old male with history of stroke, seizure disorder on Dilantin, hypertension, hyperlipidemia, complete heart block s/p PPM in August 2014, presented to the ED from [facility] for eval of suicidal ideation. During this evaluation, the patient was noted to be tachypneic, and workup was undertaken which revealed the patient to be fluid overloaded. The patient relates a history of increasing shortness of breath for the past 5 days. He also notes increasing lower extremity edema for the past week. He has been having some PND symptoms. He denies any fevers, chills, chest pain, coughing, hemoptysis, dizziness, abdominal pain, dysuria, hematuria. The patient was recently admitted to [facility] from 12/03/2012-12/06/2012 for syncope secondary to complete heart block. 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  A permanent pacemaker was placed at that time. In addition, his HCTZ was discontinued by cardiology and metoprolol tartrate was started. The patient was subsequently discharged to [facility] after he was stabilized in August.    In the ED, there was difficulty obtaining IV access. The patient had not received his intravenous furosemide at the time of my evaluation, but 40 mg IV has been ordered. Chest x-ray showed interstitial edema with proBNP of 7222. WBC was 8.4. BMP was unremarkable. LFTs were unremarkable. Assessment/Plan: Acute CHF -Last echocardiogram was in 2005 which was unable to assess the patient's EF -Repeat echocardiogram -Furosemide 40 mg IV twice a day -Strict I.'s and O.'s -Daily weights -Continue metoprolol tartrate and hydralazine -12/06/2012 TSH 2.897  -Cycle troponins  Depression with suicidal ideation -I have consulted psychiatry already -sitter at bedside -Continue Lexapro which was only  started on the day prior to admission Hypertension -Discontinue Maxzide -Continue hydralazine and metoprolol tartrate -hold amlodipine Seizure disorder  -Continue Dilantin  -Has not had any seizures for several years  History of stroke  - Continue aspirin 81 mg daily       Past Medical History  Diagnosis Date  . Stroke   . Seizures   . Hypertension   . Hyperlipidemia   . Prostate cancer     radiation  . 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 Seizures 10/04/2012    Remote , last seizure in 1994. On Dilantin  100 mg bd po.  . Depression   . Dementia    Past Surgical History  Procedure Laterality Date  . Cva  2006  . Appendectomy  1939   Social History:  reports that he has never smoked. He has never used smokeless tobacco. He reports that he does not drink alcohol or use illicit drugs.   No family history on file.   Allergies  Allergen Reactions  . Risedronate Sodium Other (See Comments)    Severe headache  . Penicillins Other (See Comments)  . Pollen Extract Other (See Comments)      Prior to Admission medications   Medication Sig Start Date End Date Taking? Authorizing Provider  albuterol (PROVENTIL HFA;VENTOLIN HFA) 108 (90 BASE) MCG/ACT inhaler Inhale 2 puffs into the lungs 2 (two) times daily.   Yes Historical Provider, MD  albuterol (PROVENTIL) (2.5 MG/3ML) 0.083% nebulizer solution Take 2.5 mg by nebulization every 6 (six) hours as needed for shortness of breath.   Yes Historical Provider, MD  amLODipine (NORVASC) 5 MG tablet Take 5 mg by mouth daily. 09/04/12  Yes Historical Provider, MD  aspirin EC  81 MG EC tablet Take 1 tablet (81 mg total) by mouth daily. 12/06/12  Yes Catarina Hartshorn, MD  Calcium-Vitamin D 600-200 MG-UNIT per tablet Take 1 tablet by mouth 2 (two) times daily.     Yes Historical Provider, MD  escitalopram (LEXAPRO) 10 MG tablet Take 10 mg by mouth daily.   Yes Historical Provider, MD  folic acid (FOLVITE) 1 MG tablet Take 1 mg by mouth every  morning.     Yes Historical Provider, MD  hydrALAZINE (APRESOLINE) 50 MG tablet Take 1 tablet (50 mg total) by mouth every 8 (eight) hours. 12/06/12  Yes Catarina Hartshorn, MD  metoprolol (LOPRESSOR) 50 MG tablet Take 1 tablet (50 mg total) by mouth 2 (two) times daily. 12/06/12  Yes Catarina Hartshorn, MD  phenytoin (DILANTIN) 100 MG ER capsule Take 1 capsule (100 mg total) by mouth 2 (two) times daily. 10/04/12  Yes Carmen Dohmeier, MD  pravastatin (PRAVACHOL) 40 MG tablet Take 40 mg by mouth at bedtime.   Yes Historical Provider, MD  sennosides-docusate sodium (SENOKOT-S) 8.6-50 MG tablet Take 2 tablets by mouth at bedtime as needed for constipation.   Yes Historical Provider, MD  triamterene-hydrochlorothiazide (MAXZIDE-25) 37.5-25 MG per tablet Take 1 tablet by mouth daily.   Yes Historical Provider, MD    Review of Systems:  Constitutional:  No weight loss, night sweats, Fevers, chills, fatigue.  Head&Eyes: No headache.  No vision loss.  No eye pain or scotoma ENT:  No Difficulty swallowing,Tooth/dental problems,Sore throat,   Cardio-vascular:  No chest pain,  dizziness, palpitations  GI:  No  abdominal pain, nausea, vomiting, diarrhea, loss of appetite, hematochezia, melena, heartburn, indigestion, Resp:  No shortness of breath with exertion or at rest. No cough. No coughing up of blood .No wheezing.No chest wall deformity  Skin:  no rash or lesions.  GU:  no dysuria, change in color of urine, no urgency or frequency. No flank pain.  Musculoskeletal:  No joint pain or swelling. No decreased range of motion. No back pain.  Psych:  +suicidal ideation Neurologic: No headache, no dysesthesia, no focal weakness, no vision loss. No syncope  Physical Exam: Filed Vitals:   01/16/13 1334 01/16/13 1343 01/16/13 1733  BP:  134/68   Pulse:  60   Temp:  97.6 F (36.4 C)   TempSrc:  Oral   Resp:  20   SpO2: 96% 97% 95%   General:  A&O x 3, NAD, nontoxic, pleasant/cooperative Head/Eye: No  conjunctival hemorrhage, no icterus, Wiseman/AT, No nystagmus ENT:  No icterus,  No thrush, good dentition, no pharyngeal exudate Neck:  No masses, no lymphadenpathy, no bruits CV:  RRR, no rub, no gallop, +JVD Lung:  Bilateral crackles. No wheezing. Good air movement. Abdomen: soft/NT, +BS, nondistended, no peritoneal signs Ext: No cyanosis, No rashes, No petechiae, No lymphangitis, 2+ LEedema   Labs on Admission:  Basic Metabolic Panel:  Recent Labs Lab 01/16/13 1423  NA 137  K 3.9  CL 101  CO2 27  GLUCOSE 101*  BUN 21  CREATININE 1.09  CALCIUM 9.0   Liver Function Tests:  Recent Labs Lab 01/16/13 1423  AST 15  ALT 12  ALKPHOS 128*  BILITOT 0.2*  PROT 6.4  ALBUMIN 3.0*   No results found for this basename: LIPASE, AMYLASE,  in the last 168 hours No results found for this basename: AMMONIA,  in the last 168 hours CBC:  Recent Labs Lab 01/16/13 1423  WBC 8.4  HGB 13.9  HCT 40.4  MCV 101.0*  PLT 257   Cardiac Enzymes: No results found for this basename: CKTOTAL, CKMB, CKMBINDEX, TROPONINI,  in the last 168 hours BNP: No components found with this basename: POCBNP,  CBG: No results found for this basename: GLUCAP,  in the last 168 hours  Radiological Exams on Admission: Dg Chest 2 View  01/16/2013   CLINICAL DATA:  Shortness of breath.  EXAM: CHEST  2 VIEW  COMPARISON:  01/05/2013 and 02/25/2011  FINDINGS: Dual lead pacer in place. Heart size is normal but there is pulmonary vascular congestion superimposed on fairly severe chronic interstitial lung disease. Small bilateral pleural effusions.  No acute osseous abnormality. Old well-healed right clavicle fracture.  IMPRESSION: 1. New pulmonary vascular congestion. 2. Chronic interstitial lung disease. I suspect there is slight interstitial edema superimposed on the chronic lung disease. 3. Small bilateral pleural effusions.   Electronically Signed   By: Geanie Cooley   On: 01/16/2013 15:05    EKG: Independently  reviewed. Paced rhythm    Time spent:70 minutes Code Status:   FULL Family Communication:   Niece at bedside   Tobey Lippard, DO  [facility] Pager [phone]  If 7PM-7AM, please contact night-coverage [email or web address] Password [facility] 01/16/2013, 5:48 PM

## 2013-01-16 NOTE — ED Provider Notes (Addendum)
CSN: [id number]     Arrival date & time 01/16/13  1334 History   First MD Initiated Contact with Patient 01/16/13 1400     No chief complaint on file.  (Consider location/radiation/quality/duration/timing/severity/associated sxs/prior Treatment) HPI Comments: Patient brought to the ER from [place] nursing home. Patient was told yesterday that he would not be able to leave the nursing home, that he would be residing in skilled care for the rest of his life. Patient has become very depressed about this. Patient has been thinking about committing suicide and wishing he was dead, but is not have any plans. He does have a history of depression. Patient was sent to the ER for psychiatric evaluation.   Past Medical History  Diagnosis Date  . Stroke   . Seizures   . Hypertension   . Hyperlipidemia   . Prostate cancer     radiation  . 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 Seizures 10/04/2012    Remote , last seizure in 1994. On Dilantin  100 mg bd po.  . Depression   . Dementia    Past Surgical History  Procedure Laterality Date  . Cva  2006  . Appendectomy  1939   No family history on file. History  Substance Use Topics  . Smoking status: Never Smoker   . Smokeless tobacco: Never Used  . Alcohol Use: No    Review of Systems  Respiratory: Positive for shortness of breath.   Psychiatric/Behavioral: Positive for suicidal ideas and dysphoric mood.  All other systems reviewed and are negative.    Allergies  Risedronate sodium; Penicillins; and Pollen extract  Home Medications   Current Outpatient Rx  Name  Route  Sig  Dispense  Refill  . albuterol (PROVENTIL HFA;VENTOLIN HFA) 108 (90 BASE) MCG/ACT inhaler   Inhalation   Inhale 2 puffs into the lungs 2 (two) times daily.         Marland Kitchen albuterol (PROVENTIL) (2.5 MG/3ML) 0.083% nebulizer solution   Nebulization   Take 2.5 mg by nebulization every 6 (six) hours as needed for shortness of breath.         Marland Kitchen amLODipine (NORVASC)  5 MG tablet   Oral   Take 5 mg by mouth daily.         Marland Kitchen aspirin EC 81 MG EC tablet   Oral   Take 1 tablet (81 mg total) by mouth daily.   30 tablet   1   . Calcium-Vitamin D 600-200 MG-UNIT per tablet   Oral   Take 1 tablet by mouth 2 (two) times daily.           Marland Kitchen escitalopram (LEXAPRO) 10 MG tablet   Oral   Take 10 mg by mouth daily.         . folic acid (FOLVITE) 1 MG tablet   Oral   Take 1 mg by mouth every morning.           . hydrALAZINE (APRESOLINE) 50 MG tablet   Oral   Take 1 tablet (50 mg total) by mouth every 8 (eight) hours.   90 tablet   0   . metoprolol (LOPRESSOR) 50 MG tablet   Oral   Take 1 tablet (50 mg total) by mouth 2 (two) times daily.   60 tablet   0   . phenytoin (DILANTIN) 100 MG ER capsule   Oral   Take 1 capsule (100 mg total) by mouth 2 (two) times daily.   180 capsule  3   . pravastatin (PRAVACHOL) 40 MG tablet   Oral   Take 40 mg by mouth at bedtime.         . sennosides-docusate sodium (SENOKOT-S) 8.6-50 MG tablet   Oral   Take 2 tablets by mouth at bedtime as needed for constipation.         . triamterene-hydrochlorothiazide (MAXZIDE-25) 37.5-25 MG per tablet   Oral   Take 1 tablet by mouth daily.          BP 134/68  Pulse 60  Temp(Src) 97.6 F (36.4 C) (Oral)  Resp 20  SpO2 97% Physical Exam  Constitutional: He is oriented to person, place, and time. He appears well-developed and well-nourished. No distress.  HENT:  Head: Normocephalic and atraumatic.  Right Ear: Hearing normal.  Left Ear: Hearing normal.  Nose: Nose normal.  Mouth/Throat: Oropharynx is clear and moist and mucous membranes are normal.  Eyes: Conjunctivae and EOM are normal. Pupils are equal, round, and reactive to light.  Neck: Normal range of motion. Neck supple.  Cardiovascular: Regular rhythm, S1 normal and S2 normal.  Exam reveals no gallop and no friction rub.   No murmur heard. Pulmonary/Chest: Accessory muscle usage present.  Tachypnea noted. No respiratory distress. He has rales. He exhibits no tenderness.  Abdominal: Soft. Normal appearance and bowel sounds are normal. There is no hepatosplenomegaly. There is no tenderness. There is no rebound, no guarding, no tenderness at McBurney's point and negative Murphy's sign. No hernia.  Musculoskeletal: Normal range of motion.  Neurological: He is alert and oriented to person, place, and time. He has normal strength. No cranial nerve deficit or sensory deficit. Coordination normal. GCS eye subscore is 4. GCS verbal subscore is 5. GCS motor subscore is 6.  Skin: Skin is warm, dry and intact. No rash noted. No cyanosis.  Psychiatric: He has a normal mood and affect. His speech is normal and behavior is normal. Thought content normal.    ED Course  Procedures (including critical care time)  EKG:  Date: 01/16/2013  Rate: 65  Rhythm: atrial sensed v-pacing   Old EKG Reviewed: unchanged   Labs Review Labs Reviewed  CBC - Abnormal; Notable for the following:    RBC 4.00 (*)    MCV 101.0 (*)    MCH 34.8 (*)    All other components within normal limits  COMPREHENSIVE METABOLIC PANEL - Abnormal; Notable for the following:    Glucose, Bld 101 (*)    Albumin 3.0 (*)    Alkaline Phosphatase 128 (*)    Total Bilirubin 0.2 (*)    GFR calc non Af Amer 60 (*)    GFR calc Af Amer 69 (*)    All other components within normal limits  SALICYLATE LEVEL - Abnormal; Notable for the following:    Salicylate Lvl <2.0 (*)    All other components within normal limits  PRO B NATRIURETIC PEPTIDE - Abnormal; Notable for the following:    Pro B Natriuretic peptide (BNP) 7222.0 (*)    All other components within normal limits  ACETAMINOPHEN LEVEL  ETHANOL  URINE RAPID DRUG SCREEN (HOSP PERFORMED)  TROPONIN I   Imaging Review Dg Chest 2 View  01/16/2013   CLINICAL DATA:  Shortness of breath.  EXAM: CHEST  2 VIEW  COMPARISON:  01/05/2013 and 02/25/2011  FINDINGS: Dual lead pacer in  place. Heart size is normal but there is pulmonary vascular congestion superimposed on fairly severe chronic interstitial lung disease. Small bilateral pleural  effusions.  No acute osseous abnormality. Old well-healed right clavicle fracture.  IMPRESSION: 1. New pulmonary vascular congestion. 2. Chronic interstitial lung disease. I suspect there is slight interstitial edema superimposed on the chronic lung disease. 3. Small bilateral pleural effusions.   Electronically Signed   By: Geanie Cooley   On: 01/16/2013 15:05    MDM  Diagnosis: 1. CHF 2. Depression  She presented to the ER for evaluation of depression his depression centers around his living conditions, now living in a skilled nursing facility for the first time. He does have a history of depression and has been suicidal. He was sent to the ER for psychiatric evaluation. Medical screening exam, however, shows that the patient is in congestive heart failure. She did endorse shortness of breath and, oxygen saturation, however is not significantly diminished. Talking with the family is that the patient has a history of using Lasix until he was placed in the nursing home. He saw his cardiologist yesterday and had Lasix restarted, but patient still is volume overloaded by chest x-ray, exam as well as significantly elevated BNP. He will require hospitalization for congestive heart failure treatment. He will then require psychiatric evaluation after he has been medically treated.    Gilda Crease, MD 01/16/13 1702  Gilda Crease, MD 01/16/13 [phone]

## 2013-01-17 DIAGNOSIS — F329 Major depressive disorder, single episode, unspecified: Secondary | ICD-10-CM

## 2013-01-17 DIAGNOSIS — I359 Nonrheumatic aortic valve disorder, unspecified: Secondary | ICD-10-CM

## 2013-01-17 LAB — BASIC METABOLIC PANEL
BUN: 20 mg/dL (ref 6–23)
Chloride: 102 mEq/L (ref 96–112)
Creatinine, Ser: 1.06 mg/dL (ref 0.50–1.35)
GFR calc Af Amer: 72 mL/min — ABNORMAL LOW (ref >90)
GFR calc non Af Amer: 62 mL/min — ABNORMAL LOW (ref >90)
Potassium: 3.5 mEq/L (ref 3.5–5.1)

## 2013-01-17 LAB — RAPID URINE DRUG SCREEN, HOSP PERFORMED
Barbiturates: NOT DETECTED
Benzodiazepines: NOT DETECTED
Cocaine: NOT DETECTED
Opiates: NOT DETECTED
Tetrahydrocannabinol: NOT DETECTED

## 2013-01-17 NOTE — Progress Notes (Signed)
Nutrition Brief Note  Patient identified on the Malnutrition Screening Tool (MST) Report. Pt denies poor appetite. He states that he is unsure if he has had weight loss, per chart review, pt with stable weight and recent weight gain.  Wt Readings from Last 15 Encounters:  01/17/13 162 lb 14.7 oz (73.9 kg)  12/04/12 149 lb 14.6 oz (68 kg)  12/04/12 149 lb 14.6 oz (68 kg)  10/04/12 154 lb (69.854 kg)  10/04/12 162 lb (73.483 kg)    Body mass index is 25.51 kg/(m^2). Patient meets criteria for Overweight based on current BMI.   Current diet order is Heart Healthy, patient is consuming approximately 50% of meals at this time. Labs and medications reviewed.   No nutrition interventions warranted at this time. If nutrition issues arise, please consult RD.   [name] Motto MS, RD, LDN Pager: [phone] After-hours pager: [phone]

## 2013-01-17 NOTE — Consult Note (Signed)
[facility] Face-to-Face Psychiatry Consult   Reason for Consult:  Depression Referring Physician:  Dr Tat  Lenon Ahmadi is an 79 y.o. male.  Assessment: AXIS I:  Depressive Disorder NOS AXIS II:  Deferred AXIS III:   Past Medical History  Diagnosis Date  . Stroke   . Seizures   . Hypertension   . Hyperlipidemia   . Prostate cancer     radiation  . 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 Seizures 10/04/2012    Remote , last seizure in 1994. On Dilantin  100 mg bd po.  . Depression   . Dementia   . Acute CHF 01/16/2013   AXIS IV:  other psychosocial or environmental problems, problems related to social environment and problems with primary support group AXIS V:  51-60 moderate symptoms  Plan:  No evidence of imminent risk to self or others at present.   Supportive therapy provided about ongoing stressors. Discussed crisis plan, support from social network, calling 911, coming to the Emergency Department, and calling Suicide Hotline.  Subjective:   VITO BEG is a 79 y.o. male patient admitted with depression and fluid overload.  HPI:  Patient seen chart reviewed.  Patient is 79 year old man who had a history of stroke, seizure disorder, hypertension and hyperlipidemia with complete heart block admitted on the medical floor with initial complain of depression and suicidal ideation.  He was found to be tachypneic and fluid overload.  Patient is a poor historian however admitted that for past 2 weeks he is being very sad and depressed.  Patient did not elaborate the stressors that causing the depression.  He denies any crying spells, irritability, decrease in energy or any hallucination.  He admitted some anxiety and nervousness secondary to his physical condition however he denies any active or passive suicidal thoughts or plan.  He is taking Lexapro which he believes given by his primary care physician.  Patient appears in distress due to some difficulty in breathing.  He has a permanent  pacemaker on his last admission in August 2014.  As per staff he has not agitated on the floor, he is not involved in any aggression or combative behavior.  He appears sad however he denies any suicidal thoughts.  There were no delusions or paranoia present at this time.  He has difficulty remembering things and he also has difficulty expressing his symptoms however he was clear that he does not have any active or passive suicidal thoughts or plan.  He admitted to depression and sadness which he believes could be due to his physical illness.  He denies any side effects of Lexapro which is given by his physician.  Patient do not recall admitting to psychiatric hospital or having any suicidal attempt in the past. HPI Elements:   Location:  Medical floor. Quality:  Fair. Severity:  Mild to moderate.  Past Psychiatric History: Past Medical History  Diagnosis Date  . Stroke   . Seizures   . Hypertension   . Hyperlipidemia   . Prostate cancer     radiation  . 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 Seizures 10/04/2012    Remote , last seizure in 1994. On Dilantin  100 mg bd po.  . Depression   . Dementia   . Acute CHF 01/16/2013    reports that he has never smoked. He has never used smokeless tobacco. He reports that he does not drink alcohol or use illicit drugs. No family history on file.  Abuse/Neglect [facility]) Physical Abuse: Denies Verbal Abuse: Denies Sexual Abuse: Denies Allergies:   Allergies  Allergen Reactions  . Risedronate Sodium Other (See Comments)    Severe headache  . Penicillins Other (See Comments)  . Pollen Extract Other (See Comments)    ACT Assessment Complete:  No:   Past Psychiatric History: Diagnosis:  Depression   Hospitalizations:  Denies   Outpatient Care:  With primary care physician   Substance Abuse Care:  Denies   Self-Mutilation:  Denies   Suicidal Attempts:  Denies   Homicidal Behaviors:  Denies    Violent Behaviors:  Denies    Place of  Residence:  Nursing home Marital Status:  Widowed.  Wife died 10 years ago.  Stepchildren lives in [place]. Employed/Unemployed:  Retired, worked as a Stage manager in the past. Education:  High school Family Supports:  Limited Objective: Blood pressure 156/76, pulse 59, temperature 97.6 F (36.4 C), temperature source Oral, resp. rate 20, height 5\' 7"  (1.702 m), weight 162 lb 14.7 oz (73.9 kg), SpO2 94.00%.Body mass index is 25.51 kg/(m^2). Results for orders placed during the hospital encounter of 01/16/13 (from the past 72 hour(s))  ACETAMINOPHEN LEVEL     Status: None   Collection Time    01/16/13  2:23 PM      Result Value Range   Acetaminophen (Tylenol), Serum <15.0  10 - 30 ug/mL   Comment:            THERAPEUTIC CONCENTRATIONS VARY     SIGNIFICANTLY. A RANGE OF 10-30     ug/mL MAY BE AN EFFECTIVE     CONCENTRATION FOR MANY PATIENTS.     HOWEVER, SOME ARE BEST TREATED     AT CONCENTRATIONS OUTSIDE THIS     RANGE.     ACETAMINOPHEN CONCENTRATIONS     >150 ug/mL AT 4 HOURS AFTER     INGESTION AND >50 ug/mL AT 12     HOURS AFTER INGESTION ARE     OFTEN ASSOCIATED WITH TOXIC     REACTIONS.  CBC     Status: Abnormal   Collection Time    01/16/13  2:23 PM      Result Value Range   WBC 8.4  4.0 - 10.5 K/uL   RBC 4.00 (*) 4.22 - 5.81 MIL/uL   Hemoglobin 13.9  13.0 - 17.0 g/dL   HCT 16.1  09.6 - 04.5 %   MCV 101.0 (*) 78.0 - 100.0 fL   MCH 34.8 (*) 26.0 - 34.0 pg   MCHC 34.4  30.0 - 36.0 g/dL   RDW 40.9  81.1 - 91.4 %   Platelets 257  150 - 400 K/uL  COMPREHENSIVE METABOLIC PANEL     Status: Abnormal   Collection Time    01/16/13  2:23 PM      Result Value Range   Sodium 137  135 - 145 mEq/L   Potassium 3.9  3.5 - 5.1 mEq/L   Chloride 101  96 - 112 mEq/L   CO2 27  19 - 32 mEq/L   Glucose, Bld 101 (*) 70 - 99 mg/dL   BUN 21  6 - 23 mg/dL   Creatinine, Ser 7.82  0.50 - 1.35 mg/dL   Calcium 9.0  8.4 - 95.6 mg/dL   Total Protein 6.4  6.0 - 8.3 g/dL   Albumin 3.0  (*) 3.5 - 5.2 g/dL   AST 15  0 - 37 U/L   ALT 12  0 - 53 U/L  Alkaline Phosphatase 128 (*) 39 - 117 U/L   Total Bilirubin 0.2 (*) 0.3 - 1.2 mg/dL   GFR calc non Af Amer 60 (*) >90 mL/min   GFR calc Af Amer 69 (*) >90 mL/min   Comment: (NOTE)     The eGFR has been calculated using the CKD EPI equation.     This calculation has not been validated in all clinical situations.     eGFR's persistently <90 mL/min signify possible Chronic Kidney     Disease.  ETHANOL     Status: None   Collection Time    01/16/13  2:23 PM      Result Value Range   Alcohol, Ethyl (B) <11  0 - 11 mg/dL   Comment:            LOWEST DETECTABLE LIMIT FOR     SERUM ALCOHOL IS 11 mg/dL     FOR MEDICAL PURPOSES ONLY     Performed at [facility]  SALICYLATE LEVEL     Status: Abnormal   Collection Time    01/16/13  2:23 PM      Result Value Range   Salicylate Lvl <2.0 (*) 2.8 - 20.0 mg/dL  PRO B NATRIURETIC PEPTIDE     Status: Abnormal   Collection Time    01/16/13  2:23 PM      Result Value Range   Pro B Natriuretic peptide (BNP) 7222.0 (*) 0 - 450 pg/mL  POCT I-STAT TROPONIN I     Status: None   Collection Time    01/16/13  4:48 PM      Result Value Range   Troponin i, poc 0.05  0.00 - 0.08 ng/mL   Comment 3            Comment: Due to the release kinetics of cTnI,     a negative result within the first hours     of the onset of symptoms does not rule out     myocardial infarction with certainty.     If myocardial infarction is still suspected,     repeat the test at appropriate intervals.  TROPONIN I     Status: None   Collection Time    01/16/13  5:45 PM      Result Value Range   Troponin I <0.30  <0.30 ng/mL   Comment:            Due to the release kinetics of cTnI,     a negative result within the first hours     of the onset of symptoms does not rule out     myocardial infarction with certainty.     If myocardial infarction is still suspected,     repeat the test at appropriate  intervals.  TROPONIN I     Status: None   Collection Time    01/16/13 10:10 PM      Result Value Range   Troponin I <0.30  <0.30 ng/mL   Comment:            Due to the release kinetics of cTnI,     a negative result within the first hours     of the onset of symptoms does not rule out     myocardial infarction with certainty.     If myocardial infarction is still suspected,     repeat the test at appropriate intervals.  BASIC METABOLIC PANEL     Status: Abnormal   Collection Time  01/17/13  4:23 AM      Result Value Range   Sodium 139  135 - 145 mEq/L   Potassium 3.5  3.5 - 5.1 mEq/L   Chloride 102  96 - 112 mEq/L   CO2 28  19 - 32 mEq/L   Glucose, Bld 105 (*) 70 - 99 mg/dL   BUN 20  6 - 23 mg/dL   Creatinine, Ser 1.61  0.50 - 1.35 mg/dL   Calcium 8.9  8.4 - 09.6 mg/dL   GFR calc non Af Amer 62 (*) >90 mL/min   GFR calc Af Amer 72 (*) >90 mL/min   Comment: (NOTE)     The eGFR has been calculated using the CKD EPI equation.     This calculation has not been validated in all clinical situations.     eGFR's persistently <90 mL/min signify possible Chronic Kidney     Disease.  TROPONIN I     Status: None   Collection Time    01/17/13  4:23 AM      Result Value Range   Troponin I <0.30  <0.30 ng/mL   Comment:            Due to the release kinetics of cTnI,     a negative result within the first hours     of the onset of symptoms does not rule out     myocardial infarction with certainty.     If myocardial infarction is still suspected,     repeat the test at appropriate intervals.  URINE RAPID DRUG SCREEN (HOSP PERFORMED)     Status: None   Collection Time    01/17/13  5:20 AM      Result Value Range   Opiates NONE DETECTED  NONE DETECTED   Cocaine NONE DETECTED  NONE DETECTED   Benzodiazepines NONE DETECTED  NONE DETECTED   Amphetamines NONE DETECTED  NONE DETECTED   Tetrahydrocannabinol NONE DETECTED  NONE DETECTED   Barbiturates NONE DETECTED  NONE DETECTED    Comment:            DRUG SCREEN FOR MEDICAL PURPOSES     ONLY.  IF CONFIRMATION IS NEEDED     FOR ANY PURPOSE, NOTIFY LAB     WITHIN 5 DAYS.                LOWEST DETECTABLE LIMITS     FOR URINE DRUG SCREEN     Drug Class       Cutoff (ng/mL)     Amphetamine      1000     Barbiturate      200     Benzodiazepine   200     Tricyclics       300     Opiates          300     Cocaine          300     THC              50   Labs are reviewed and are pertinent for high alkaline phosphatase.  Current Facility-Administered Medications  Medication Dose Route Frequency Provider Last Rate Last Dose  . 0.9 %  sodium chloride infusion  250 mL Intravenous PRN Catarina Hartshorn, MD      . acetaminophen (TYLENOL) tablet 650 mg  650 mg Oral Q4H PRN Catarina Hartshorn, MD      . albuterol (PROVENTIL) (5 MG/ML) 0.5% nebulizer solution 2.5 mg  2.5 mg Nebulization Q6H PRN Catarina Hartshorn, MD      . aspirin EC tablet 81 mg  81 mg Oral Daily Catarina Hartshorn, MD   81 mg at 01/17/13 1115  . calcium-vitamin D (OSCAL WITH D) 500-200 MG-UNIT per tablet 1 tablet  1 tablet Oral BID Catarina Hartshorn, MD   1 tablet at 01/17/13 1114  . enoxaparin (LOVENOX) injection 40 mg  40 mg Subcutaneous Q24H Catarina Hartshorn, MD   40 mg at 01/17/13 0119  . escitalopram (LEXAPRO) tablet 10 mg  10 mg Oral Daily Catarina Hartshorn, MD   10 mg at 01/17/13 1115  . folic acid (FOLVITE) tablet 1 mg  1 mg Oral Daily Catarina Hartshorn, MD   1 mg at 01/17/13 1114  . furosemide (LASIX) injection 40 mg  40 mg Intravenous BID Catarina Hartshorn, MD   40 mg at 01/17/13 1633  . hydrALAZINE (APRESOLINE) tablet 50 mg  50 mg Oral Q8H Catarina Hartshorn, MD   50 mg at 01/17/13 1633  . metoprolol (LOPRESSOR) tablet 50 mg  50 mg Oral BID Catarina Hartshorn, MD   50 mg at 01/17/13 1114  . ondansetron (ZOFRAN) injection 4 mg  4 mg Intravenous Q6H PRN Catarina Hartshorn, MD      . phenytoin (DILANTIN) ER capsule 100 mg  100 mg Oral BID Catarina Hartshorn, MD   100 mg at 01/17/13 1114  . senna-docusate (Senokot-S) tablet 2 tablet  2 tablet Oral QHS PRN  Catarina Hartshorn, MD      . simvastatin (ZOCOR) tablet 20 mg  20 mg Oral q1800 Catarina Hartshorn, MD   20 mg at 01/17/13 1633  . sodium chloride 0.9 % injection 3 mL  3 mL Intravenous Q12H Catarina Hartshorn, MD   3 mL at 01/17/13 1000  . sodium chloride 0.9 % injection 3 mL  3 mL Intravenous PRN Catarina Hartshorn, MD        Psychiatric Specialty Exam:     Blood pressure 156/76, pulse 59, temperature 97.6 F (36.4 C), temperature source Oral, resp. rate 20, height 5\' 7"  (1.702 m), weight 162 lb 14.7 oz (73.9 kg), SpO2 94.00%.Body mass index is 25.51 kg/(m^2).  General Appearance: Casual  Eye Contact::  Fair  Speech:  Slow  Volume:  Decreased  Mood:  Depressed  Affect:  Constricted  Thought Process:  Coherent and Slow  Orientation:  Other:  Alert and oriented x2  Thought Content:  Rumination  Suicidal Thoughts:  No  Homicidal Thoughts:  No  Memory:  Fair  Judgement:  Intact  Insight:  Present  Psychomotor Activity:  Decreased  Concentration:  Fair  Recall:  Fair  Akathisia:  No  Handed:  Right  AIMS (if indicated):     Assets:  Desire for Improvement Housing  Sleep:      Treatment Plan Summary: Patient is taking Lexapro 10 mg without side effects.  At this time he does not require inpatient psychiatric services however consider increasing Lexapro to 15 mg if not medically contraindicated.  Recommend outpatient services and referral for further optimizing treatment for his depression.  Please call [phone] if you have further questions.  Thank you for allowing me in patient's care.  ARFEEN,SYED T. 01/17/2013 4:38 PM

## 2013-01-17 NOTE — Progress Notes (Signed)
[facility] PROGRESS NOTE  [name] Holmes AVW:[id number] DOB: Sep 21, 1924 DOA: 01/16/2013 PCP: Rayford Halsted, PT Brief HPI: 79 year old male with history of stroke, seizure disorder on Dilantin, hypertension, hyperlipidemia, complete heart block s/p PPM in August 2014, presented to the ED from [facility] for eval of suicidal ideation. During this evaluation, the patient was noted to be tachypneic, and workup was undertaken which revealed the patient to be fluid overloaded. The patient relates a history of increasing shortness of breath for the past 5 days. In the ED, there was difficulty obtaining IV access. The patient had not received his intravenous furosemide at the time of my evaluation, but 40 mg IV has been ordered. Chest x-ray showed interstitial edema with proBNP of 7222.   Assessment/Plan:  Acute CHF  - admitted to telemetry -Last echocardiogram was in 2005 which was unable to assess the patient's EF  -Repeat echocardiogram  -Furosemide 40 mg IV twice a day  -Strict I.'s and O.'s  -I/O last 3 completed shifts: In: 240 [P.O.:240] Out: 1400 [Urine:1400] Total I/O In: 120 [P.O.:120] Out: 900 [Urine:900]    -Daily weights  -Continue metoprolol tartrate and hydralazine  -12/06/2012 TSH 2.897  -Cycled troponins negative.   Depression with suicidal ideation  - psychiatry consulted . -sitter at bedside  -Continue Lexapro which was only started on the day prior to admission   Hypertension  -Discontinue Maxzide  -Continue hydralazine and metoprolol tartrate    Seizure disorder  -Continue Dilantin  -Has not had any seizures for several years   History of stroke  - Continue aspirin 81 mg daily      Code Status: partial Family Communication: none at bedside Disposition Plan: pending   Consultants:  none  Procedures:  Echo pending  Antibiotics:  none  HPI/Subjective:   Objective: Filed Vitals:   01/17/13 0557  BP: 152/72  Pulse: 62   Temp: 97.6 F (36.4 C)  Resp: 20    Intake/Output Summary (Last 24 hours) at 01/17/13 1457 Last data filed at 01/17/13 1300  Gross per 24 hour  Intake    360 ml  Output   2300 ml  Net  -1940 ml   Filed Weights   01/16/13 2038 01/17/13 0557  Weight: 73.9 kg (162 lb 14.7 oz) 73.9 kg (162 lb 14.7 oz)    Exam:   General:  Alert afebrile comfortable  Cardiovascular: s1s2  Respiratory: CTAB  Abdomen: soft NT ND BS+  Musculoskeletal:  PEDAL EDEMA PRESENT.   Data Reviewed: Basic Metabolic Panel:  Recent Labs Lab 01/16/13 1423 01/17/13 0423  NA 137 139  K 3.9 3.5  CL 101 102  CO2 27 28  GLUCOSE 101* 105*  BUN 21 20  CREATININE 1.09 1.06  CALCIUM 9.0 8.9   Liver Function Tests:  Recent Labs Lab 01/16/13 1423  AST 15  ALT 12  ALKPHOS 128*  BILITOT 0.2*  PROT 6.4  ALBUMIN 3.0*   No results found for this basename: LIPASE, AMYLASE,  in the last 168 hours No results found for this basename: AMMONIA,  in the last 168 hours CBC:  Recent Labs Lab 01/16/13 1423  WBC 8.4  HGB 13.9  HCT 40.4  MCV 101.0*  PLT 257   Cardiac Enzymes:  Recent Labs Lab 01/16/13 1745 01/16/13 2210 01/17/13 0423  TROPONINI <0.30 <0.30 <0.30   BNP (last 3 results)  Recent Labs  01/16/13 1423  PROBNP 7222.0*   CBG: No results found for this basename: GLUCAP,  in the  last 168 hours  No results found for this or any previous visit (from the past 240 hour(s)).   Studies: Dg Chest 2 View  01/16/2013   CLINICAL DATA:  Shortness of breath.  EXAM: CHEST  2 VIEW  COMPARISON:  01/05/2013 and 02/25/2011  FINDINGS: Dual lead pacer in place. Heart size is normal but there is pulmonary vascular congestion superimposed on fairly severe chronic interstitial lung disease. Small bilateral pleural effusions.  No acute osseous abnormality. Old well-healed right clavicle fracture.  IMPRESSION: 1. New pulmonary vascular congestion. 2. Chronic interstitial lung disease. I suspect there is  slight interstitial edema superimposed on the chronic lung disease. 3. Small bilateral pleural effusions.   Electronically Signed   By: Geanie Cooley   On: 01/16/2013 15:05    Scheduled Meds: . aspirin EC  81 mg Oral Daily  . calcium-vitamin D  1 tablet Oral BID  . enoxaparin (LOVENOX) injection  40 mg Subcutaneous Q24H  . escitalopram  10 mg Oral Daily  . folic acid  1 mg Oral Daily  . furosemide  40 mg Intravenous BID  . hydrALAZINE  50 mg Oral Q8H  . metoprolol  50 mg Oral BID  . phenytoin  100 mg Oral BID  . simvastatin  20 mg Oral q1800  . sodium chloride  3 mL Intravenous Q12H   Continuous Infusions:   Active Problems:   Acute CHF   Depression, major   Unspecified essential hypertension    Time spent: 25 min    Melika Reder  [facility] Pager [phone] If 7PM-7AM, please contact night-coverage at [email or web address], password [facility] 01/17/2013, 2:57 PM  LOS: 1 day

## 2013-01-17 NOTE — Progress Notes (Signed)
Clinical Social Work  CSW attempted to meet with patient but patient currently in the middle of procedure. CSW will follow up at later time to complete assessment.  [place], [place] [phone]

## 2013-01-17 NOTE — Progress Notes (Signed)
  Echocardiogram 2D Echocardiogram has been performed.  Cathie Beams 01/17/2013, 3:39 PM

## 2013-01-18 ENCOUNTER — Encounter (HOSPITAL_COMMUNITY): Payer: Self-pay | Admitting: Physician Assistant

## 2013-01-18 LAB — BASIC METABOLIC PANEL
CO2: 27 mEq/L (ref 19–32)
Calcium: 9.2 mg/dL (ref 8.4–10.5)
Chloride: 99 mEq/L (ref 96–112)
Creatinine, Ser: 1.15 mg/dL (ref 0.50–1.35)
GFR calc Af Amer: 65 mL/min — ABNORMAL LOW (ref >90)
Potassium: 3.3 mEq/L — ABNORMAL LOW (ref 3.5–5.1)
Sodium: 137 mEq/L (ref 135–145)

## 2013-01-18 MED ORDER — POTASSIUM CHLORIDE CRYS ER 20 MEQ PO TBCR
40.0000 meq | EXTENDED_RELEASE_TABLET | Freq: Two times a day (BID) | ORAL | Status: AC
  Administered 2013-01-18 (×2): 40 meq via ORAL
  Filled 2013-01-18 (×2): qty 2

## 2013-01-18 MED ORDER — LISINOPRIL 2.5 MG PO TABS
2.5000 mg | ORAL_TABLET | Freq: Every day | ORAL | Status: DC
  Administered 2013-01-18 – 2013-01-22 (×4): 2.5 mg via ORAL
  Filled 2013-01-18 (×5): qty 1

## 2013-01-18 MED ORDER — SPIRONOLACTONE 25 MG PO TABS
25.0000 mg | ORAL_TABLET | Freq: Every day | ORAL | Status: DC
  Administered 2013-01-18 – 2013-01-22 (×5): 25 mg via ORAL
  Filled 2013-01-18 (×5): qty 1

## 2013-01-18 NOTE — Progress Notes (Signed)
Clinical Social Work Department BRIEF PSYCHOSOCIAL ASSESSMENT 01/18/2013  Patient:  [name] Holmes, [name] Holmes     Account Number:  [id number]     Admit date:  01/16/2013  Clinical Social Worker:  Orpah Greek  Date/Time:  01/18/2013 01:23 PM  Referred by:  Physician  Date Referred:  01/18/2013 Referred for  Other - See comment   Other Referral:   Admitted from: [facility]   Interview type:  Patient Other interview type:   and niece, Diane via phone    PSYCHOSOCIAL DATA Living Status:  FACILITY Admitted from facility:  [facility] Level of care:  Skilled Nursing Facility Primary support name:  Felipa Eth (niece) c#: [phone] h#: [phone] Primary support relationship to patient:  FAMILY Degree of support available:   good    CURRENT CONCERNS Current Concerns  Post-Acute Placement   Other Concerns:    SOCIAL WORK ASSESSMENT / PLAN CSW received consult that patient was admitted from [facility] and niece is paying to hold [facility] bed [facility].   Assessment/plan status:  Information/Referral to [facility] Other assessment/ plan:   Information/referral to community resources:   CSW completed FL2 and faxed information to [place] [facility], confirmed with W[facility] that they would be able to take patient back when ready - anticipating discharge Monday. Awaiting PT evaluation to submit for [facility] authorization.    PATIENT'S/FAMILY'S RESPONSE TO PLAN OF CARE: Patient & niece are agreeable with plan to return to [facility]. [place] are paying privately to hold his bed.       Unice Bailey, LCSW [facility] Clinical Social Worker cell #: [phone]

## 2013-01-18 NOTE — Progress Notes (Signed)
Clinical Social Work Department CLINICAL SOCIAL WORK PSYCHIATRY SERVICE LINE ASSESSMENT 01/18/2013  Patient:  [name] Holmes  Account:  [id number]  Admit Date:  01/16/2013  Clinical Social Worker:  Unk Lightning, LCSW  Date/Time:  01/18/2013 12:00 N Referred by:  Physician  Date referred:  01/18/2013 Reason for Referral  Psychosocial assessment   Presenting Symptoms/Problems (In the person's/family's own words):   Psych consulted due to [facility]   Abuse/Neglect/Trauma History (check all that apply)  Denies history   Abuse/Neglect/Trauma Comments:   Psychiatric History (check all that apply)  Denies history   Psychiatric medications:  Lexapro 10 mg   Current Mental Health Hospitalizations/Previous Mental Health History:   Patient reports no previous diagnosis or treatment for any MH concerns.   Current provider:   None   Place and Date:   N/A   Current Medications:   sodium chloride, acetaminophen, albuterol, ondansetron (ZOFRAN) IV, senna-docusate, sodium chloride            . aspirin EC  81 mg Oral Daily  . calcium-vitamin D  1 tablet Oral BID  . enoxaparin (LOVENOX) injection  40 mg Subcutaneous Q24H  . escitalopram  10 mg Oral Daily  . folic acid  1 mg Oral Daily  . furosemide  40 mg Intravenous BID  . hydrALAZINE  50 mg Oral Q8H  . metoprolol  50 mg Oral BID  . phenytoin  100 mg Oral BID  . potassium chloride  40 mEq Oral BID  . simvastatin  20 mg Oral q1800  . sodium chloride  3 mL Intravenous Q12H   Previous Impatient Admission/Date/Reason:   None reported   Emotional Health / Current Symptoms    Suicide/Self Harm  Suicidal ideation (ex: "I can't take any more,I wish I could disappear")   Suicide attempt in the past:   Patient reports he made statements at SNF because he does not want to live there forever. Patient denies any current SI or HI. Patient denies any previous attempts.   Other harmful behavior:   None reported   Psychotic/Dissociative Symptoms   None reported   Other Psychotic/Dissociative Symptoms:   N/A    Attention/Behavioral Symptoms  Within Normal Limits   Other Attention / Behavioral Symptoms:   Patient engaged in assessment.    Cognitive Impairment  Within Normal Limits   Other Cognitive Impairment:   Patient alert and oriented during assessment.    Mood and Adjustment  Flat    Stress, Anxiety, Trauma, Any Recent Loss/Stressor  Other - See comment   Anxiety (frequency):   N/A   Phobia (specify):   N/A   Compulsive behavior (specify):   N/A   Obsessive behavior (specify):   N/A   Other:   Patient recently moved into SNF.   Substance Abuse/Use  History of substance use   SBIRT completed (please refer for detailed history):  N  Self-reported substance use:   Patient reports he would drink when he lived at home but has not drank in several years. Patient denies any further substance use.   Urinary Drug Screen Completed:  Y Alcohol level:   <11    Environmental/Housing/Living Arrangement  SKILLED NURSING FACILITY   Who is in the home:   [facility]   Emergency contact:  Diane-niece   Financial  Medicare   Patient's Strengths and Goals (patient's own words):   Patient reports nieces are involved and assist with care.   Clinical Social Worker's Interpretive Summary:   CSW received referral to complete  psychosocial assessment. CSW reviewed chart and met with patient at bedside. CSW introduced myself and explained role.    Patient reports he was married but his wife passed away about 8 years ago. Patient reported he married later in life but that he and wife were married for about 10 years. Patient does not have any biological children but reports that nieces help care for him.    Patient reports he was doing well at home but was sent to SNF for rehab. Patient feels that he has to stay there long-term and is upset that he cannot return home. Patient reports that he misses his  independence and feels lonely at the hospital and at [facility].    Patient admits to making statements about wanting to die but denies any SI or HI at this time. Patient does not have a plan and contracts for safety. Patient is unhappy with current situation but reports he is understanding of SNF for his safety.    CSW spoke with patient about depression and if he was interested in treatment. CSW spoke about adjusting to a new lifestyle and that he could participate in therapy. Patient is not interested in any outpatient treatment and is unsure if he would like to take medication for depression.    Patient was alert and oriented. Patient had a flat affect and did not elaborate on answers. Patient agreeable to return to SNF and reports that nieces can make decisions on his behalf. Psych CSW encouraged unit CSW to get psych to follow patient at SNF.    CSW will continue to follow throughout hospital stay.   Disposition:  Recommend Psych CSW continuing to support while in hospital  [place], [place] [phone]

## 2013-01-18 NOTE — Consult Note (Signed)
CARDIOLOGY CONSULT NOTE  Patient ID: [name] Holmes MRN: [id number] DOB/AGE: [birth year] 79 y.o.  Admit date: 01/16/2013 Referring Physician  Kathlen Mody, MD Primary Physician:  Rayford Halsted, PT Reason for Consultation  CHF  HPI: The patient is a 79 year old male who presents with sick sinus syndrome. [name] Holmes is a 79 year old Caucasian male with history of seizure disorder on chronic Dilantin, history of stroke in remote past with residual right-sided weakness and gait instability, who is admitted on 12/03/12 for syncope and complete heart block. Patient denies any chest pain, shortness breath, PND or orthopnea. He does have chronic gait instability. He was admitted to the hospital and on telemetry he developed torsades. He was defibrillated. He states that he has not had a seizure in many years. He is a widower, lives soft, he has 2 nieces that live in [place] for a and support him. He eventually underwent placement of dual-chamber [facility] Jude pacemaker on 12/04/2012, and discharged home on 12/06/2012 2 assisted living facility. He now presents here for follow-up. Presently in [place] rehab and working with PT and OT and spezch therapy.  He has had some depression and feels he should not have been treated and he rather had passed away due to his age and also that he is in a nursing home and lived independantly prior to the hospitalization.   Past Medical History  Diagnosis Date  . Stroke 2005  . Hypertension   . Hyperlipidemia   . Prostate cancer     radiation  . 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 Seizures 10/04/2012    Remote , last seizure in 1994. On Dilantin  100 mg bd po.  . Depression   . Dementia   . Acute CHF 01/16/2013     Past Surgical History  Procedure Laterality Date  . Prostate biopsy  2003  . Appendectomy  1939  . Pacemaker insertion  12/03/2012    St. Jude Assurity dual-chamber pacemaker, serial number [id number]     No family history on file.   Social  History: History   Social History  . Marital Status: Widowed    Spouse Name: N/A    Number of Children: N/A  . Years of Education: N/A   Occupational History  . retired     former [employer] employee   Social History Main Topics  . Smoking status: Never Smoker   . Smokeless tobacco: Never Used  . Alcohol Use: No  . Drug Use: No  . Sexual Activity: No   Other Topics Concern  . Not on file   Social History Narrative  . No narrative on file     Prescriptions prior to admission  Medication Sig Dispense Refill  . albuterol (PROVENTIL HFA;VENTOLIN HFA) 108 (90 BASE) MCG/ACT inhaler Inhale 2 puffs into the lungs 2 (two) times daily.      Marland Kitchen albuterol (PROVENTIL) (2.5 MG/3ML) 0.083% nebulizer solution Take 2.5 mg by nebulization every 6 (six) hours as needed for shortness of breath.      Marland Kitchen amLODipine (NORVASC) 5 MG tablet Take 5 mg by mouth daily.      Marland Kitchen aspirin EC 81 MG EC tablet Take 1 tablet (81 mg total) by mouth daily.  30 tablet  1  . Calcium-Vitamin D 600-200 MG-UNIT per tablet Take 1 tablet by mouth 2 (two) times daily.        Marland Kitchen escitalopram (LEXAPRO) 10 MG tablet Take 10 mg by mouth daily.      Marland Kitchen  folic acid (FOLVITE) 1 MG tablet Take 1 mg by mouth every morning.        . hydrALAZINE (APRESOLINE) 50 MG tablet Take 1 tablet (50 mg total) by mouth every 8 (eight) hours.  90 tablet  0  . metoprolol (LOPRESSOR) 50 MG tablet Take 1 tablet (50 mg total) by mouth 2 (two) times daily.  60 tablet  0  . phenytoin (DILANTIN) 100 MG ER capsule Take 1 capsule (100 mg total) by mouth 2 (two) times daily.  180 capsule  3  . pravastatin (PRAVACHOL) 40 MG tablet Take 40 mg by mouth at bedtime.      . sennosides-docusate sodium (SENOKOT-S) 8.6-50 MG tablet Take 2 tablets by mouth at bedtime as needed for constipation.      . triamterene-hydrochlorothiazide (MAXZIDE-25) 37.5-25 MG per tablet Take 1 tablet by mouth daily.        Scheduled Meds: . aspirin EC  81 mg Oral Daily  .  calcium-vitamin D  1 tablet Oral BID  . enoxaparin (LOVENOX) injection  40 mg Subcutaneous Q24H  . escitalopram  10 mg Oral Daily  . folic acid  1 mg Oral Daily  . furosemide  40 mg Intravenous BID  . hydrALAZINE  50 mg Oral Q8H  . lisinopril  2.5 mg Oral Daily  . metoprolol  50 mg Oral BID  . phenytoin  100 mg Oral BID  . potassium chloride  40 mEq Oral BID  . simvastatin  20 mg Oral q1800  . sodium chloride  3 mL Intravenous Q12H   Continuous Infusions:  PRN Meds:.sodium chloride, acetaminophen, albuterol, ondansetron (ZOFRAN) IV, senna-docusate, sodium chloride  ROS: Recent loss of memory, has history of old stroke with mild residual left-sided weakness, has been having worsening shortness of breath recently. Depressed. Other systems negative.    Physical Exam: Blood pressure 133/73, pulse 86, temperature 98.2 F (36.8 C), temperature source Oral, resp. rate 18, height 5\' 7"  (1.702 m), weight 70.2 kg (154 lb 12.2 oz), SpO2 91.00%.   General appearance: alert, cooperative, appears stated age and frail  Neck: no adenopathy, no carotid bruit, no JVD, supple, symmetrical, trachea midline and thyroid not enlarged, symmetric, no tenderness/mass/nodules  Neck: JVP - normal, carotids 2+= without bruits  Resp: clear to auscultation bilaterally  Chest wall: no tenderness  Cardio: regular rate and rhythm, S1, S2 normal, no murmur, click, rub or gallop and distant heart sounds  GI: soft, non-tender; bowel sounds normal; no masses, no organomegaly  Extremities: extremities normal, atraumatic, no cyanosis or edema  Pulses: Carotids were 2+, femorals were 2+, no bruit. Pedal pulses were 1+ bilateral  Skin: Skin color, texture, turgor normal. No rashes or lesions    Labs:   Lab Results  Component Value Date   WBC 8.4 01/16/2013   HGB 13.9 01/16/2013   HCT 40.4 01/16/2013   MCV 101.0* 01/16/2013   PLT 257 01/16/2013    Recent Labs Lab 01/16/13 1423  01/18/13 0353  NA 137  < > 137  K  3.9  < > 3.3*  CL 101  < > 99  CO2 27  < > 27  BUN 21  < > 21  CREATININE 1.09  < > 1.15  CALCIUM 9.0  < > 9.2  PROT 6.4  --   --   BILITOT 0.2*  --   --   ALKPHOS 128*  --   --   ALT 12  --   --   AST 15  --   --  GLUCOSE 101*  < > 94  < > = values in this interval not displayed. Lab Results  Component Value Date   CKTOTAL 44 12/04/2012   CKMB 4.7* 12/04/2012   TROPONINI <0.30 01/17/2013     EKG: A sense, V- paced rhythm, no further evaluation.  Echocardiogram 01-17-2013: Severe LV systolic dysfunction with suggestion of anterior wall severe hypokinesis. And grade 1 diastolic dysfunction with elevated left ventricle filling pressure, ejection fraction 25-30%.  Mild regurgitation, mitral regurgitation. Right ventricle was mildly dilated with moderate pulmonary hypertension. Scheduled Meds: . aspirin EC  81 mg Oral Daily  . calcium-vitamin D  1 tablet Oral BID  . enoxaparin (LOVENOX) injection  40 mg Subcutaneous Q24H  . escitalopram  10 mg Oral Daily  . folic acid  1 mg Oral Daily  . furosemide  40 mg Intravenous BID  . hydrALAZINE  50 mg Oral Q8H  . lisinopril  2.5 mg Oral Daily  . metoprolol  50 mg Oral BID  . phenytoin  100 mg Oral BID  . potassium chloride  40 mEq Oral BID  . simvastatin  20 mg Oral q1800  . sodium chloride  3 mL Intravenous Q12H   Continuous Infusions:  PRN Meds:.sodium chloride, acetaminophen, albuterol, ondansetron (ZOFRAN) IV, senna-docusate, sodium chloride    ASSESSMENT AND PLAN:  1. Acute on chronic systolic and diastolic heart failure, patient presenting with shortness of breath and leg edema for the very first time about a week ago and I saw him in the office one day prior to admission. He may had myocardial infarction in the recent past without any symptoms, however extremely difficult to judge. Previously he has not had any echocardiogram when he presented with syncope in August of 2014, minimalistic approach was done in a patient who  otherwise ultra elderly and frail. New-onset shortness of breath and leg edema. 2.H/O sick sinus syndrome.  History of cardiac pacemaker in situ: St. Jude Assurity dual-chamber pacemaker, serial number [id number] implantation on 12/04/2012 for complete heart block, symptomatic. 3. Essential hypertension, benign 4. Depression and suicidal ideation, and started the patient on Lexapro in the outpatient basis. 5. Leg edema, shortness breath secondary to #1, has improved.  Recommendation: Discussed with  Dr. Blake Divine regarding conservative management with regard to congestive heart failure and cardiac issues.  He is now present on appropriate medical therapy, would also had Aldactone 25 mg by mouth daily as his renal function is normal. Would avoid using digoxin given his advanced age. Otherwise continue present therapy, disposition will depend on his mental status. I be happy to follow him if he needs further assistance, however his symptoms of dyspnea has improved. I have also increased furosemide to twice a day dosing for the next 3 doses. Needs follow up BMP in 2 days.  Pamella Pert, MD 01/18/2013, 4:10 PM [facility] Pager: [phone] Office: [phone] If no answer Cell [phone]

## 2013-01-18 NOTE — Progress Notes (Signed)
Asked by hospitalist to see pt. He is a pt of Dr. Jacinto Halim [phone]. Called them and passed on the consult.

## 2013-01-18 NOTE — Progress Notes (Signed)
[facility] PROGRESS NOTE  [name] Holmes ION:[id number] DOB: [birth year] DOA: 01/16/2013 PCP: Rayford Halsted, PT Brief HPI: 79 year old male with history of stroke, seizure disorder on Dilantin, hypertension, hyperlipidemia, complete heart block s/p PPM in August 2014, presented to the ED from [facility] for eval of suicidal ideation. During this evaluation, the patient was noted to be tachypneic, and workup was undertaken which revealed the patient to be fluid overloaded. The patient relates a history of increasing shortness of breath for the past 5 days. In the ED, there was difficulty obtaining IV access. The patient had not received his intravenous furosemide at the time of my evaluation, but 40 mg IV has been ordered. Chest x-ray showed interstitial edema with proBNP of 7222.   Assessment/Plan:  Acute  Combined  Systolic and diastolic CHF  - admitted to telemetry -Repeat echocardiogram done yesterday showed EF of 25% to 30 %. -Furosemide 40 mg IV twice a day , started him on low dose ACE inhibitor.  -Strict I.'s and O.'s  -I/O last 3 completed shifts: In: 840 [P.O.:840] Out: 2650 [Urine:2650] Total I/O In: 240 [P.O.:240] Out: -   -Daily weights  -Continue metoprolol tartrate and hydralazine  -12/06/2012 TSH 2.897  -Cycled troponins negative. - cardiology consulted.  - creatinine is slightly worse , will continue to monitor.  - replete potassium as needed.    Depression with suicidal ideation  - psychiatry consulted . -sitter at bedside  -Continue Lexapro which was only started on the day prior to admission  - he reports he continues to have the suicidal thoughts now and then. But has no plans to execute the plans.   Hypertension  -Discontinue Maxzide  -Continue hydralazine and metoprolol tartrate    Seizure disorder  -Continue Dilantin  -Has not had any seizures for several years   History of stroke  - Continue aspirin 81 mg daily        Code  Status: partial Family Communication: sitter at bedside Disposition Plan: pending PT EVAL.    Consultants:  cardiologist  Procedures:  Echo  Antibiotics:  none  HPI/Subjective: Is more communicative, continues to have suicidal thoughts.   Objective: Filed Vitals:   01/18/13 0526  BP: 139/90  Pulse: 60  Temp: 97.3 F (36.3 C)  Resp: 20    Intake/Output Summary (Last 24 hours) at 01/18/13 1318 Last data filed at 01/18/13 1000  Gross per 24 hour  Intake    720 ml  Output    350 ml  Net    370 ml   Filed Weights   01/16/13 2038 01/17/13 0557 01/18/13 0526  Weight: 73.9 kg (162 lb 14.7 oz) 73.9 kg (162 lb 14.7 oz) 70.2 kg (154 lb 12.2 oz)    Exam:   General:  Alert afebrile comfortable  Cardiovascular: s1s2  Respiratory: CTAB  Abdomen: soft NT ND BS+  Musculoskeletal:  PEDAL EDEMA improved.   Data Reviewed: Basic Metabolic Panel:  Recent Labs Lab 01/16/13 1423 01/17/13 0423 01/18/13 0353  NA 137 139 137  K 3.9 3.5 3.3*  CL 101 102 99  CO2 27 28 27   GLUCOSE 101* 105* 94  BUN 21 20 21   CREATININE 1.09 1.06 1.15  CALCIUM 9.0 8.9 9.2   Liver Function Tests:  Recent Labs Lab 01/16/13 1423  AST 15  ALT 12  ALKPHOS 128*  BILITOT 0.2*  PROT 6.4  ALBUMIN 3.0*   No results found for this basename: LIPASE, AMYLASE,  in the last 168 hours  No results found for this basename: AMMONIA,  in the last 168 hours CBC:  Recent Labs Lab 01/16/13 1423  WBC 8.4  HGB 13.9  HCT 40.4  MCV 101.0*  PLT 257   Cardiac Enzymes:  Recent Labs Lab 01/16/13 1745 01/16/13 2210 01/17/13 0423  TROPONINI <0.30 <0.30 <0.30   BNP (last 3 results)  Recent Labs  01/16/13 1423  PROBNP 7222.0*   CBG: No results found for this basename: GLUCAP,  in the last 168 hours  No results found for this or any previous visit (from the past 240 hour(s)).   Studies: Dg Chest 2 View  01/16/2013   CLINICAL DATA:  Shortness of breath.  EXAM: CHEST  2 VIEW   COMPARISON:  01/05/2013 and 02/25/2011  FINDINGS: Dual lead pacer in place. Heart size is normal but there is pulmonary vascular congestion superimposed on fairly severe chronic interstitial lung disease. Small bilateral pleural effusions.  No acute osseous abnormality. Old well-healed right clavicle fracture.  IMPRESSION: 1. New pulmonary vascular congestion. 2. Chronic interstitial lung disease. I suspect there is slight interstitial edema superimposed on the chronic lung disease. 3. Small bilateral pleural effusions.   Electronically Signed   By: Geanie Cooley   On: 01/16/2013 15:05    Scheduled Meds: . aspirin EC  81 mg Oral Daily  . calcium-vitamin D  1 tablet Oral BID  . enoxaparin (LOVENOX) injection  40 mg Subcutaneous Q24H  . escitalopram  10 mg Oral Daily  . folic acid  1 mg Oral Daily  . furosemide  40 mg Intravenous BID  . hydrALAZINE  50 mg Oral Q8H  . metoprolol  50 mg Oral BID  . phenytoin  100 mg Oral BID  . potassium chloride  40 mEq Oral BID  . simvastatin  20 mg Oral q1800  . sodium chloride  3 mL Intravenous Q12H   Continuous Infusions:   Active Problems:   Acute CHF   Depression, major   Unspecified essential hypertension    Time spent: 25 min    Shivani Barrantes  [facility] Pager [phone] If 7PM-7AM, please contact night-coverage at [email or web address], password [facility] 01/18/2013, 1:18 PM  LOS: 2 days

## 2013-01-18 NOTE — Evaluation (Signed)
Physical Therapy Evaluation Patient Details Name: [name] Holmes MRN: [id number] DOB: 03/12/1925 Today's Date: 01/18/2013 Time: 4098-1191 PT Time Calculation (min): 10 min  PT Assessment / Plan / Recommendation History of Present Illness  79 y.o. male admitted to [facility] from SNF on 12/03/12 with syncopal episode found to be in complete heart block.  He is now s/p pacemaker on 12/04/12  Clinical Impression  On eval, pt required +2 assist for mobility-able to ambulate ~20 feet with RW. Demonstrates high risk for falls. Pt leans heavily posteriorly and to L and R sides. Recommend return to SNF.     PT Assessment  Patient needs continued PT services    Follow Up Recommendations  SNF    Does the patient have the potential to tolerate intense rehabilitation      Barriers to Discharge        Equipment Recommendations  None recommended by PT    Recommendations for Other Services     Frequency Min 2X/week    Precautions / Restrictions Precautions Precautions: Fall Restrictions Weight Bearing Restrictions: No   Pertinent Vitals/Pain No c/o pain      Mobility  Bed Mobility Bed Mobility: Supine to Sit;Sit to Supine Supine to Sit: 3: Mod assist Sit to Supine: 3: Mod assist Details for Bed Mobility Assistance: Assist for trunk. Increased time.  Transfers Transfers: Sit to Stand;Stand to Sit Sit to Stand: 1: +2 Total assist;From bed;From elevated surface Sit to Stand: Patient Percentage: 50% Stand to Sit: 1: +2 Total assist;To bed Stand to Sit: Patient Percentage: 50% Details for Transfer Assistance: Assist to rise, stabilize, control descent. VCs safety, hand placement.  Ambulation/Gait Ambulation/Gait Assistance: 1: +2 Total assist Ambulation/Gait: Patient Percentage: 50% Ambulation Distance (Feet): 20 Feet Assistive device: Rolling walker Ambulation/Gait Assistance Details: assist to stabilize and maneuver with RW. Pt leaning heavily posteriorly and to L/R side while  attempting to ambulate . slow gait speed. High fall risk.  Gait Pattern: Step-to pattern;Step-through pattern    Exercises     PT Diagnosis: Difficulty walking;Abnormality of gait;Generalized weakness  PT Problem List: Decreased strength;Decreased balance;Decreased mobility;Decreased coordination;Decreased knowledge of use of DME PT Treatment Interventions: DME instruction;Gait training;Functional mobility training;Therapeutic activities;Therapeutic exercise;Patient/family education     PT Goals(Current goals can be found in the care plan section) Acute Rehab PT Goals Patient Stated Goal: none stated PT Goal Formulation: Patient unable to participate in goal setting Time For Goal Achievement: 02/01/13 Potential to Achieve Goals: Fair  Visit Information  Last PT Received On: 01/18/13 Assistance Needed: +2 History of Present Illness: 79 y.o. male admitted to [facility] from SNF on 12/03/12 with syncopal episode found to be in complete heart block.  He is now s/p pacemaker on 12/04/12       Prior Functioning  Home Living Family/patient expects to be discharged to:: Skilled nursing facility Prior Function Level of Independence: Needs assistance Communication Communication: No difficulties    Cognition  Cognition Arousal/Alertness: Awake/Holmes Behavior During Therapy: WFL for tasks assessed/performed Overall Cognitive Status: History of cognitive impairments - at baseline    Extremity/Trunk Assessment Upper Extremity Assessment Upper Extremity Assessment: Generalized weakness Lower Extremity Assessment Lower Extremity Assessment: Generalized weakness   Balance Balance Balance Assessed: Yes Static Standing Balance Static Standing - Balance Support: Bilateral upper extremity supported Static Standing - Level of Assistance: 1: +1 Total assist Dynamic Standing Balance Dynamic Standing - Balance Support: Bilateral upper extremity supported Dynamic Standing - Level of Assistance: 1: +2  Total assist  End of Session PT -  End of Session Equipment Utilized During Treatment: Gait belt Activity Tolerance: Patient limited by fatigue Patient left: in bed;with call bell/phone within reach;with bed alarm set  GP     [name] Holmes, MPT Pager: [phone]

## 2013-01-19 ENCOUNTER — Inpatient Hospital Stay (HOSPITAL_COMMUNITY): Payer: Medicare Other

## 2013-01-19 LAB — BASIC METABOLIC PANEL
CO2: 27 mEq/L (ref 19–32)
Calcium: 9.5 mg/dL (ref 8.4–10.5)
Chloride: 99 mEq/L (ref 96–112)
Glucose, Bld: 92 mg/dL (ref 70–99)
Potassium: 3.6 mEq/L (ref 3.5–5.1)
Sodium: 135 mEq/L (ref 135–145)

## 2013-01-19 LAB — MAGNESIUM: Magnesium: 1.7 mg/dL (ref 1.5–2.5)

## 2013-01-19 MED ORDER — POLYETHYLENE GLYCOL 3350 17 G PO PACK
17.0000 g | PACK | Freq: Every day | ORAL | Status: DC
  Administered 2013-01-20 – 2013-01-22 (×3): 17 g via ORAL
  Filled 2013-01-19 (×4): qty 1

## 2013-01-19 MED ORDER — DOCUSATE SODIUM 100 MG PO CAPS
100.0000 mg | ORAL_CAPSULE | Freq: Two times a day (BID) | ORAL | Status: DC
  Administered 2013-01-20 – 2013-01-22 (×4): 100 mg via ORAL
  Filled 2013-01-19 (×8): qty 1

## 2013-01-19 NOTE — Progress Notes (Signed)
Patient complains of constipation, PRN senokot was given at bedtime last night but patient is also requesting for an enema if that does not help.  I will pass information on to day nurse to discuss with MD during their rounds.    Ernesta Amble, RN

## 2013-01-19 NOTE — Progress Notes (Signed)
[facility] PROGRESS NOTE  MESSI TWEDT ZOX:[id number] DOB: [birth year] DOA: 01/16/2013 PCP: Rayford Halsted, PT Brief HPI: 79 year old male with history of stroke, seizure disorder on Dilantin, hypertension, hyperlipidemia, complete heart block s/p PPM in August 2014, presented to the ED from [facility] for eval of suicidal ideation. During this evaluation, the patient was noted to be tachypneic, and workup was undertaken which revealed the patient to be fluid overloaded. The patient relates a history of increasing shortness of breath for the past 5 days. In the ED, there was difficulty obtaining IV access. The patient had not received his intravenous furosemide at the time of my evaluation, but 40 mg IV has been ordered. Chest x-ray showed interstitial edema with proBNP of 7222.   Assessment/Plan:  Acute  Combined  Systolic and diastolic CHF  - admitted to telemetry -Repeat echocardiogram done yesterday showed EF of 25% to 30 %. -Furosemide 40 mg IV twice a day , started him on low dose ACE inhibitor.  -Strict I.'s and O.'s  -I/O last 3 completed shifts: In: 613 [P.O.:610; I.V.:3] Out: 2650 [Urine:2650] Total I/O In: 360 [P.O.:360] Out: 401 [Urine:400; Stool:1]  -Daily weights  -Continue metoprolol tartrate and hydralazine  -12/06/2012 TSH 2.897  -Cycled troponins negative. - cardiology consulted.  - creatinine is slightly worse , will continue to monitor.  - replete potassium as needed.    Depression with suicidal ideation  - psychiatry consulted . -sitter at bedside  -Continue Lexapro which was only started on the day prior to admission  - he reports he continues to have the suicidal thoughts now and then. But has no plans to execute the plans.   Hypertension  -Discontinue Maxzide  -Continue hydralazine and metoprolol tartrate    Seizure disorder  -Continue Dilantin  -Has not had any seizures for several years   History of stroke  - Continue aspirin 81 mg  daily        Code Status: partial Family Communication: sitter at bedside Disposition Plan: pending PT EVAL.    Consultants:  cardiologist  Procedures:  Echo  Antibiotics:  none  HPI/Subjective: Is more communicative, today he reports he doesn't have any suicidal  thoughts.   Objective: Filed Vitals:   01/19/13 0531  BP: 116/64  Pulse: 60  Temp: 97.6 F (36.4 C)  Resp: 18    Intake/Output Summary (Last 24 hours) at 01/19/13 1226 Last data filed at 01/19/13 1100  Gross per 24 hour  Intake    493 ml  Output   2701 ml  Net  -2208 ml   Filed Weights   01/17/13 0557 01/18/13 0526 01/19/13 0531  Weight: 73.9 kg (162 lb 14.7 oz) 70.2 kg (154 lb 12.2 oz) 69 kg (152 lb 1.9 oz)    Exam:   General:  Alert afebrile comfortable  Cardiovascular: s1s2  Respiratory: CTAB  Abdomen: soft NT ND BS+  Musculoskeletal:  PEDAL EDEMA improved.   Data Reviewed: Basic Metabolic Panel:  Recent Labs Lab 01/16/13 1423 01/17/13 0423 01/18/13 0353 01/19/13 0454  NA 137 139 137 135  K 3.9 3.5 3.3* 3.6  CL 101 102 99 99  CO2 27 28 27 27   GLUCOSE 101* 105* 94 92  BUN 21 20 21 22   CREATININE 1.09 1.06 1.15 1.14  CALCIUM 9.0 8.9 9.2 9.5  MG  --   --   --  1.7   Liver Function Tests:  Recent Labs Lab 01/16/13 1423  AST 15  ALT 12  ALKPHOS  128*  BILITOT 0.2*  PROT 6.4  ALBUMIN 3.0*   No results found for this basename: LIPASE, AMYLASE,  in the last 168 hours No results found for this basename: AMMONIA,  in the last 168 hours CBC:  Recent Labs Lab 01/16/13 1423  WBC 8.4  HGB 13.9  HCT 40.4  MCV 101.0*  PLT 257   Cardiac Enzymes:  Recent Labs Lab 01/16/13 1745 01/16/13 2210 01/17/13 0423  TROPONINI <0.30 <0.30 <0.30   BNP (last 3 results)  Recent Labs  01/16/13 1423  PROBNP 7222.0*   CBG: No results found for this basename: GLUCAP,  in the last 168 hours  No results found for this or any previous visit (from the past 240 hour(s)).    Studies: No results found.  Scheduled Meds: . aspirin EC  81 mg Oral Daily  . calcium-vitamin D  1 tablet Oral BID  . docusate sodium  100 mg Oral BID  . enoxaparin (LOVENOX) injection  40 mg Subcutaneous Q24H  . escitalopram  10 mg Oral Daily  . folic acid  1 mg Oral Daily  . furosemide  40 mg Intravenous BID  . hydrALAZINE  50 mg Oral Q8H  . lisinopril  2.5 mg Oral Daily  . metoprolol  50 mg Oral BID  . phenytoin  100 mg Oral BID  . polyethylene glycol  17 g Oral Daily  . simvastatin  20 mg Oral q1800  . sodium chloride  3 mL Intravenous Q12H  . spironolactone  25 mg Oral Daily   Continuous Infusions:   Active Problems:   Acute CHF   Depression, major   Unspecified essential hypertension    Time spent: 25 min    Reese Stockman  [facility] Pager [phone] If 7PM-7AM, please contact night-coverage at [email or web address], password [facility] 01/19/2013, 12:26 PM  LOS: 3 days

## 2013-01-20 ENCOUNTER — Inpatient Hospital Stay (HOSPITAL_COMMUNITY): Payer: Medicare Other

## 2013-01-20 LAB — PHENYTOIN LEVEL, TOTAL: Phenytoin Lvl: 5.1 ug/mL — ABNORMAL LOW (ref 10.0–20.0)

## 2013-01-20 MED ORDER — PHENYTOIN SODIUM EXTENDED 100 MG PO CAPS
100.0000 mg | ORAL_CAPSULE | Freq: Three times a day (TID) | ORAL | Status: DC
  Administered 2013-01-21 (×3): 100 mg via ORAL
  Filled 2013-01-20 (×6): qty 1

## 2013-01-20 MED ORDER — FUROSEMIDE 40 MG PO TABS
40.0000 mg | ORAL_TABLET | Freq: Two times a day (BID) | ORAL | Status: DC
  Filled 2013-01-20: qty 1

## 2013-01-20 MED ORDER — PHENYTOIN SODIUM 50 MG/ML IJ SOLN
500.0000 mg | Freq: Once | INTRAMUSCULAR | Status: AC
  Administered 2013-01-21: 500 mg via INTRAVENOUS
  Filled 2013-01-20: qty 10

## 2013-01-20 MED ORDER — FUROSEMIDE 40 MG PO TABS
40.0000 mg | ORAL_TABLET | Freq: Every day | ORAL | Status: DC
  Administered 2013-01-21 – 2013-01-22 (×2): 40 mg via ORAL
  Filled 2013-01-20 (×3): qty 1

## 2013-01-20 MED ORDER — HYDRALAZINE HCL 25 MG PO TABS
25.0000 mg | ORAL_TABLET | Freq: Three times a day (TID) | ORAL | Status: DC
  Administered 2013-01-21: 25 mg via ORAL
  Filled 2013-01-20 (×5): qty 1

## 2013-01-20 MED ORDER — FUROSEMIDE 40 MG PO TABS: 40.0000 mg | ORAL_TABLET | Freq: Two times a day (BID) | ORAL | Status: DC

## 2013-01-20 NOTE — Progress Notes (Addendum)
[facility] PROGRESS NOTE  [name] Holmes ZOX:[id number] DOB: 03/11/1925 DOA: 01/16/2013 PCP: Rayford Halsted, PT Brief HPI: 79 year old male with history of stroke, seizure disorder on Dilantin, hypertension, hyperlipidemia, complete heart block s/p PPM in August 2014, presented to the ED from [facility] for eval of suicidal ideation. During this evaluation, the patient was noted to be tachypneic, and workup was undertaken which revealed the patient to be fluid overloaded. The patient relates a history of increasing shortness of breath for the past 5 days. In the ED, there was difficulty obtaining IV access. The patient had not received his intravenous furosemide at the time of my evaluation, but 40 mg IV has been ordered. Chest x-ray showed interstitial edema with proBNP of 7222.   Assessment/Plan:  Acute  Combined  Systolic and diastolic CHF  - admitted to telemetry -Repeat echocardiogram done yesterday showed EF of 25% to 30 %. -Furosemide 40 mg IV twice a day , started him on low dose ACE inhibitor.  -Strict I.'s and O.'s  -I/O last 3 completed shifts: In: 723 [P.O.:720; I.V.:3] Out: 3629 [Urine:3625; Stool:4] Total I/O In: 340 [P.O.:340] Out: 350 [Urine:350]  -Daily weights  -Continue metoprolol tartrate and hydralazine  -12/06/2012 TSH 2.897  -Cycled troponins negative. - cardiology consulted.  - creatinine is slightly worse , will continue to monitor.  - replete potassium as needed.   - repeat PRObnp in am.   Depression with suicidal ideation  - psychiatry consulted . -sitter at bedside  -Continue Lexapro which was only started on the day prior to admission  - he reports he doesnot have any suicidal thoughts.  Hypertension  -Discontinue Maxzide  -Continue hydralazine and metoprolol tartrate    Seizure disorder  -Continue Dilantin  -Has not had any seizures for several years   History of stroke  - Continue aspirin 81 mg daily        Code Status:  partial Family Communication: sitter at bedside Disposition Plan: pending PT EVAL.    Consultants:  cardiologist  Procedures:  Echo  Antibiotics:  none  HPI/Subjective: Is more communicative, today he reports he doesn't have any suicidal  thoughts.   Objective: Filed Vitals:   01/20/13 0543  BP: 136/76  Pulse: 59  Temp: 98.4 F (36.9 C)  Resp: 16    Intake/Output Summary (Last 24 hours) at 01/20/13 1441 Last data filed at 01/20/13 1327  Gross per 24 hour  Intake    580 ml  Output   2077 ml  Net  -1497 ml   Filed Weights   01/17/13 0557 01/18/13 0526 01/19/13 0531  Weight: 73.9 kg (162 lb 14.7 oz) 70.2 kg (154 lb 12.2 oz) 69 kg (152 lb 1.9 oz)    Exam:   General:  Alert afebrile comfortable  Cardiovascular: s1s2  Respiratory: CTAB  Abdomen: soft NT ND BS+  Musculoskeletal:  PEDAL EDEMA improved.   Data Reviewed: Basic Metabolic Panel:  Recent Labs Lab 01/16/13 1423 01/17/13 0423 01/18/13 0353 01/19/13 0454  NA 137 139 137 135  K 3.9 3.5 3.3* 3.6  CL 101 102 99 99  CO2 27 28 27 27   GLUCOSE 101* 105* 94 92  BUN 21 20 21 22   CREATININE 1.09 1.06 1.15 1.14  CALCIUM 9.0 8.9 9.2 9.5  MG  --   --   --  1.7   Liver Function Tests:  Recent Labs Lab 01/16/13 1423  AST 15  ALT 12  ALKPHOS 128*  BILITOT 0.2*  PROT 6.4  ALBUMIN 3.0*   No results found for this basename: LIPASE, AMYLASE,  in the last 168 hours No results found for this basename: AMMONIA,  in the last 168 hours CBC:  Recent Labs Lab 01/16/13 1423  WBC 8.4  HGB 13.9  HCT 40.4  MCV 101.0*  PLT 257   Cardiac Enzymes:  Recent Labs Lab 01/16/13 1745 01/16/13 2210 01/17/13 0423  TROPONINI <0.30 <0.30 <0.30   BNP (last 3 results)  Recent Labs  01/16/13 1423  PROBNP 7222.0*   CBG: No results found for this basename: GLUCAP,  in the last 168 hours  No results found for this or any previous visit (from the past 240 hour(s)).   Studies: Dg Chest 2  View  01/19/2013   CLINICAL DATA:  Shortness of breath and chest pain.  EXAM: CHEST  2 VIEW  COMPARISON:  01/16/2013 and 11/08/2010  FINDINGS: Left-sided pacemaker unchanged. The patient is slightly rotated to the left. There is bibasilar interstitial disease which is chronic. There is mild bibasilar opacification likely small effusions with atelectasis unchanged as cannot completely exclude infection in the lung bases. Cardiomediastinal silhouette and remainder of the exam is unchanged to include a severe compression fracture over the lower thoracic spine.  IMPRESSION: Bibasilar opacification compatible with small effusions likely with associated atelectasis. Cannot completely exclude infection in the lung bases.   Electronically Signed   By: Elberta Fortis M.D.   On: 01/19/2013 17:04    Scheduled Meds: . aspirin EC  81 mg Oral Daily  . calcium-vitamin D  1 tablet Oral BID  . docusate sodium  100 mg Oral BID  . enoxaparin (LOVENOX) injection  40 mg Subcutaneous Q24H  . escitalopram  10 mg Oral Daily  . folic acid  1 mg Oral Daily  . furosemide  40 mg Intravenous BID  . hydrALAZINE  50 mg Oral Q8H  . lisinopril  2.5 mg Oral Daily  . metoprolol  50 mg Oral BID  . phenytoin  100 mg Oral BID  . polyethylene glycol  17 g Oral Daily  . simvastatin  20 mg Oral q1800  . sodium chloride  3 mL Intravenous Q12H  . spironolactone  25 mg Oral Daily   Continuous Infusions:   Active Problems:   Acute CHF   Depression, major   Unspecified essential hypertension    Time spent: 25 min    Mercedees Convery  [facility] Pager [phone] If 7PM-7AM, please contact night-coverage at [email or web address], password [facility] 01/20/2013, 2:41 PM  LOS: 4 days             I have decreased Hydralazine due to borderline Low BP. Continue aldactone and if S. Cr is worsening, then only discontinue this. Consider decreasing lasix to q daily due to decreased intake

## 2013-01-20 NOTE — Progress Notes (Signed)
Dr. Blake Divine paged again regarding low BP. However, BP rechecked and WNL's, writer still has question about adm IV Lasix. Pt has been asymptomatic, eating well, and pleasant this entire shift. He denies any suicidal ideations, sitter remains at bedside as MD states pt has only had these "feelings" at night.

## 2013-01-20 NOTE — Progress Notes (Addendum)
And in [facility] progress note. Chief complaint. Decreased level of consciousness. History of present illness. This 79 year old male admitted with congestive heart failure, depression with suicidal ideation, hypertension, prior seizure disorder. Apparently the patient was at baseline she is verbal and appropriate over the course of the day. The last note from nursing at 6 PM indicates patient alert and at baseline. I was called to the bedside by nursing who found the patient to be a hypersomnolent and no really not arousable. I came to the bedside and found this elderly gentleman to be responsive to painful stimuli. He appears able to move all 4 extremities and is resistive to eye exam and oral exam. He does resist movement in all 4 extremities with no strength deficit noted. Patient will make minimal verbalizations. When asked his name he states" I don't know". Physical exam. Vital signs 97.7, pulse 60, respiration 18, blood pressure 1:15/56. O2 sats 96. General appearance. [place] elderly male who is hypersomnolent but will arouse somewhat to sternal rub. Cardiac. Rate and rhythm regular. Lungs. Breath sounds reduced but clear. Abdomen. Soft with positive bowel sounds. No pain. Neurologic. Noncooperative with pupil exam. Resistive to passive range of motion in all 4 extremities with no unilateral strength deficit noted. Patient resistive to oral exam. Patient will awaken somewhat with sternal rub but unable to tell me his name, location, etc. Impression/plan. Altered mental status. Patient  last documented as normal at approximately 6 PM. I will order stat CT scan of the head to rule out stroke or bleed. Also a serum Dilantin level to rule out Dilantin toxicity. I did discuss the case with neurology Doctor Roseanne Reno who agreed with CAT scan and Dilantin level. He indicated he suspected a seizure and now a postictal state. Ct san of head found no acute changes. Dilantin level returned low,  even when corrected for albumin. Dr Roseanne Reno contacted me and requested a dose of IV Dilantin 500 mg and an increase dosing of routine Dilantin from 100 mg BID to 100 mg TID and orders placed as requested.

## 2013-01-21 LAB — BASIC METABOLIC PANEL
CO2: 27 mEq/L (ref 19–32)
Calcium: 8.9 mg/dL (ref 8.4–10.5)
Chloride: 98 mEq/L (ref 96–112)
Glucose, Bld: 110 mg/dL — ABNORMAL HIGH (ref 70–99)
Potassium: 3.6 mEq/L (ref 3.5–5.1)
Sodium: 134 mEq/L — ABNORMAL LOW (ref 135–145)

## 2013-01-21 NOTE — Evaluation (Signed)
Clinical/Bedside Swallow Evaluation Patient Details  Name: [name] Holmes MRN: [id number] Date of Birth: 11-14-1924  Today's Date: 01/21/2013 Time: 1205-1235 SLP Time Calculation (min): 30 min  Past Medical History:  Past Medical History  Diagnosis Date  . Stroke 2005  . Hypertension   . Hyperlipidemia   . Prostate cancer     radiation  . 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 Seizures 10/04/2012    Remote , last seizure in 1994. On Dilantin  100 mg bd po.  . Depression   . Dementia   . Acute CHF 01/16/2013   Past Surgical History:  Past Surgical History  Procedure Laterality Date  . Prostate biopsy  2003  . Appendectomy  1939  . Pacemaker insertion  12/03/2012    [employer] Assurity dual-chamber pacemaker, serial number [id number]   HPI:  79 year old male with history of stroke, seizure disorder on Dilantin, hypertension, hyperlipidemia, complete heart block s/p PPM in August 2014, presented to the ED from [facility] for eval of suicidal ideation. During this evaluation, the patient was noted to be tachypneic, and workup was undertaken which revealed the patient to be fluid overloaded. The patient relates a history of increasing shortness of breath for the past 5 days. He also notes increasing lower extremity edema for the past week. He has been having some PND symptoms. He denies any fevers, chills, chest pain, coughing, hemoptysis, dizziness, abdominal pain, dysuria, hematuria. The patient was recently admitted to [facility] from 12/03/2012-12/06/2012 for syncope secondary to complete heart block. 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  A permanent pacemaker was placed at that time. In addition, his HCTZ was discontinued by cardiology and metoprolol tartrate was started. The patient was subsequently discharged to [facility] after he was stabilized in August   Assessment / Plan / Recommendation Clinical Impression  Pt. appears to have normal swallow function with all consistencies  presented at bedside.  Will downgrade diet to Dysphagia 3 due to missing lower dentition and use of upper dentures.    Aspiration Risk  Mild    Diet Recommendation Dysphagia 3 (Mechanical Soft)   Liquid Administration via: Cup;Straw Medication Administration: Whole meds with liquid Supervision: Patient able to self feed;Intermittent supervision to cue for compensatory strategies Compensations: Slow rate;Small sips/bites Postural Changes and/or Swallow Maneuvers: Seated upright 90 degrees    Other  Recommendations Oral Care Recommendations: Oral care BID Other Recommendations: Clarify dietary restrictions   Follow Up Recommendations  Skilled Nursing facility    Frequency and Duration        Pertinent Vitals/Pain n/a         Swallow Study Prior Functional Status       General HPI: 79 year old male with history of stroke, seizure disorder on Dilantin, hypertension, hyperlipidemia, complete heart block s/p PPM in August 2014, presented to the ED from [facility] for eval of suicidal ideation. During this evaluation, the patient was noted to be tachypneic, and workup was undertaken which revealed the patient to be fluid overloaded. The patient relates a history of increasing shortness of breath for the past 5 days. He also notes increasing lower extremity edema for the past week. He has been having some PND symptoms. He denies any fevers, chills, chest pain, coughing, hemoptysis, dizziness, abdominal pain, dysuria, hematuria. The patient was recently admitted to [facility] from 12/03/2012-12/06/2012 for syncope secondary to complete heart block. 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  A permanent pacemaker was placed at that time.  In addition, his HCTZ was discontinued by cardiology and metoprolol tartrate was started. The patient was subsequently discharged to [facility] after he was stabilized in August Type of Study: Bedside swallow evaluation Previous Swallow Assessment:  None Diet Prior to this Study: Regular;Thin liquids Temperature Spikes Noted: No Respiratory Status: Room air History of Recent Intubation: No Behavior/Cognition: Alert;Cooperative Oral Cavity - Dentition: Dentures, top;Missing dentition Self-Feeding Abilities: Needs assist Patient Positioning: Upright in bed Baseline Vocal Quality: Clear Volitional Cough: Weak Volitional Swallow: Unable to elicit    Oral/Motor/Sensory Function Overall Oral Motor/Sensory Function: Appears within functional limits for tasks assessed   Ice Chips Ice chips: Within functional limits   Thin Liquid Thin Liquid: Within functional limits Presentation: Spoon;Cup;Straw    Nectar Thick Nectar Thick Liquid: Not tested   Honey Thick Honey Thick Liquid: Not tested   Puree Puree: Within functional limits Presentation: Spoon   Solid   GO    Solid: Within functional limits Presentation: Self Daine Gravel, Kaelin Bonelli T 01/21/2013,12:44 PM

## 2013-01-21 NOTE — Progress Notes (Signed)
[facility] PROGRESS NOTE  [name] Holmes ZOX:[id number] DOB: 05/07/1924 DOA: 01/16/2013 PCP: Rayford Halsted, PT Brief HPI: 79 year old male with history of stroke, seizure disorder on Dilantin, hypertension, hyperlipidemia, complete heart block s/p PPM in August 2014, presented to the ED from [facility] for eval of suicidal ideation. During this evaluation, the patient was noted to be tachypneic, and workup was undertaken which revealed the patient to be fluid overloaded. The patient relates a history of increasing shortness of breath for the past 5 days. In the ED, there was difficulty obtaining IV access. The patient had not received his intravenous furosemide at the time of my evaluation, but 40 mg IV has been ordered. Chest x-ray showed interstitial edema with proBNP of 7222.   Assessment/Plan:  Acute  Combined  Systolic and diastolic CHF  - admitted to telemetry -Repeat echocardiogram done  showed EF of 25% to 30 %. -Furosemide 40 mg IV twice a day ,He became hypotensive on 10/5 and his lasix was changed from IV to po  And the dose was decreased to once daily and hydralazine discontinued. Repeat blood pressures parameters have stabilized a little. He was started on lisinopril for his low EF, but as his creatinine worsened, we are holding the lisinopril temporarily.  -Strict I.'s and O.'s  -I/O last 3 completed shifts: In: 460 [P.O.:460] Out: 1551 [Urine:1550; Stool:1] Total I/O In: 120 [P.O.:120] Out: -   -Daily weights  -Continue metoprolol tartrate  -12/06/2012 TSH 2.897  -Cycled troponins negative. - cardiology consulted.  - creatinine is slightly worse , probably from decreased po intake and hypovolemia. We decreased the dose of lasix and held the blood pressure medications.  - replete potassium as needed.   - repeat PRObnp in am.   Depression with suicidal ideation  - psychiatry consulted . -sitter at bedside  -Continue Lexapro which was only started on the day  prior to admission  - he reports he doesnot have any suicidal thoughts.  Hypertension  -Discontinue Maxzide   - continue with  metoprolol tartrate    Seizure disorder  -Continue Dilantin  - overnight he became lethargic and a CT head was obtained , it was negative for stroke,. NP discussed with Dr Roseanne Reno and his dilantin level was obtained. His level was subtherapeutic after albumin correction. He was given an extra dose of IV dilantin and resumed the po dilantin in am. We will repeat level in am.   History of stroke  - Continue aspirin 81 mg daily   DVT prophylaxis.      Code Status: partial Family Communication:none at bedside.  Disposition Plan: pending PT EVAL.    Consultants:  Cardiology Dr Jacinto Halim.  Procedures:  Echo  Antibiotics:  none  HPI/Subjective: IS LETHARGIC, but comfortable, no seizure activity.   Objective: Filed Vitals:   01/21/13 0927  BP: 90/52  Pulse: 60  Temp:   Resp:     Intake/Output Summary (Last 24 hours) at 01/21/13 1044 Last data filed at 01/21/13 0900  Gross per 24 hour  Intake    340 ml  Output    350 ml  Net    -10 ml   Filed Weights   01/18/13 0526 01/19/13 0531 01/21/13 0410  Weight: 70.2 kg (154 lb 12.2 oz) 69 kg (152 lb 1.9 oz) 70 kg (154 lb 5.2 oz)    Exam:   General:  Alert afebrile comfortable  Cardiovascular: s1s2  Respiratory: CTAB  Abdomen: soft NT ND BS+  Musculoskeletal:  PEDAL  EDEMA improved.   Data Reviewed: Basic Metabolic Panel:  Recent Labs Lab 01/16/13 1423 01/17/13 0423 01/18/13 0353 01/19/13 0454 01/21/13 0420  NA 137 139 137 135 134*  K 3.9 3.5 3.3* 3.6 3.6  CL 101 102 99 99 98  CO2 27 28 27 27 27   GLUCOSE 101* 105* 94 92 110*  BUN 21 20 21 22  38*  CREATININE 1.09 1.06 1.15 1.14 1.50*  CALCIUM 9.0 8.9 9.2 9.5 8.9  MG  --   --   --  1.7  --    Liver Function Tests:  Recent Labs Lab 01/16/13 1423  AST 15  ALT 12  ALKPHOS 128*  BILITOT 0.2*  PROT 6.4  ALBUMIN 3.0*    No results found for this basename: LIPASE, AMYLASE,  in the last 168 hours No results found for this basename: AMMONIA,  in the last 168 hours CBC:  Recent Labs Lab 01/16/13 1423  WBC 8.4  HGB 13.9  HCT 40.4  MCV 101.0*  PLT 257   Cardiac Enzymes:  Recent Labs Lab 01/16/13 1745 01/16/13 2210 01/17/13 0423  TROPONINI <0.30 <0.30 <0.30   BNP (last 3 results)  Recent Labs  01/16/13 1423  PROBNP 7222.0*   CBG: No results found for this basename: GLUCAP,  in the last 168 hours  No results found for this or any previous visit (from the past 240 hour(s)).   Studies: Dg Chest 2 View  01/19/2013   CLINICAL DATA:  Shortness of breath and chest pain.  EXAM: CHEST  2 VIEW  COMPARISON:  01/16/2013 and 11/08/2010  FINDINGS: Left-sided pacemaker unchanged. The patient is slightly rotated to the left. There is bibasilar interstitial disease which is chronic. There is mild bibasilar opacification likely small effusions with atelectasis unchanged as cannot completely exclude infection in the lung bases. Cardiomediastinal silhouette and remainder of the exam is unchanged to include a severe compression fracture over the lower thoracic spine.  IMPRESSION: Bibasilar opacification compatible with small effusions likely with associated atelectasis. Cannot completely exclude infection in the lung bases.   Electronically Signed   By: Elberta Fortis M.D.   On: 01/19/2013 17:04   Ct Head Wo Contrast  01/20/2013   *RADIOLOGY REPORT*  Clinical Data: Decreased level of consciousness; severe lethargy.  CT HEAD WITHOUT CONTRAST  Technique:  Contiguous axial images were obtained from the base of the skull through the vertex without contrast.  Comparison: CT of the head performed 12/03/2012  Findings: There is no evidence of acute infarction, mass lesion, or intra- or extra-axial hemorrhage on CT.  There is relatively stable severe ventriculomegaly, with underlying prominence of the sulci.  There is no  evidence of underlying obstructive lesion.  Periventricular and subcortical white matter change likely reflects small vessel ischemic microangiopathy.  The posterior fossa, including the cerebellum, brainstem and fourth ventricle, is within normal limits.  The cerebral hemispheres demonstrate grossly normal gray-white differentiation.  No midline shift is seen.  There is no evidence of fracture; visualized osseous structures are unremarkable in appearance.  The orbits are within normal limits. The paranasal sinuses and mastoid air cells are well-aerated.  No significant soft tissue abnormalities are seen.  IMPRESSION:  1.  No acute intracranial pathology seen on CT. 2.  Relatively stable severe ventriculomegaly, without definite evidence of underlying obstruction. 3.  Small vessel ischemic microangiopathy.   Original Report Authenticated By: Tonia Ghent, M.D.    Scheduled Meds: . aspirin EC  81 mg Oral Daily  . calcium-vitamin D  1 tablet Oral BID  . docusate sodium  100 mg Oral BID  . enoxaparin (LOVENOX) injection  40 mg Subcutaneous Q24H  . escitalopram  10 mg Oral Daily  . folic acid  1 mg Oral Daily  . furosemide  40 mg Oral Daily  . lisinopril  2.5 mg Oral Daily  . metoprolol  50 mg Oral BID  . phenytoin  100 mg Oral TID  . polyethylene glycol  17 g Oral Daily  . simvastatin  20 mg Oral q1800  . sodium chloride  3 mL Intravenous Q12H  . spironolactone  25 mg Oral Daily   Continuous Infusions:   Active Problems:   Acute CHF   Depression, major   Unspecified essential hypertension    Time spent: 25 min    Ellissa Ayo  [facility] Pager [phone] If 7PM-7AM, please contact night-coverage at [email or web address], password [facility] 01/21/2013, 10:44 AM  LOS: 5 days

## 2013-01-21 NOTE — Progress Notes (Signed)
Pt BP 90/52 HR 60. Lethargic. MD notified.Will continue to monitor.

## 2013-01-22 LAB — PRO B NATRIURETIC PEPTIDE: Pro B Natriuretic peptide (BNP): 2875 pg/mL — ABNORMAL HIGH (ref 0–450)

## 2013-01-22 LAB — COMPREHENSIVE METABOLIC PANEL
AST: 15 U/L (ref 0–37)
Albumin: 2.9 g/dL — ABNORMAL LOW (ref 3.5–5.2)
Alkaline Phosphatase: 128 U/L — ABNORMAL HIGH (ref 39–117)
BUN: 34 mg/dL — ABNORMAL HIGH (ref 6–23)
Chloride: 96 mEq/L (ref 96–112)
GFR calc non Af Amer: 53 mL/min — ABNORMAL LOW (ref >90)
Potassium: 4.1 mEq/L (ref 3.5–5.1)
Sodium: 133 mEq/L — ABNORMAL LOW (ref 135–145)
Total Bilirubin: 0.4 mg/dL (ref 0.3–1.2)
Total Protein: 6.4 g/dL (ref 6.0–8.3)

## 2013-01-22 LAB — PHENYTOIN LEVEL, TOTAL: Phenytoin Lvl: 10.5 ug/mL (ref 10.0–20.0)

## 2013-01-22 MED ORDER — POLYETHYLENE GLYCOL 3350 17 G PO PACK: 17.0000 g | PACK | Freq: Every day | ORAL | Status: DC | PRN

## 2013-01-22 MED ORDER — LISINOPRIL 2.5 MG PO TABS: 2.5000 mg | ORAL_TABLET | Freq: Every day | ORAL | Status: DC

## 2013-01-22 MED ORDER — SPIRONOLACTONE 25 MG PO TABS: 25.0000 mg | ORAL_TABLET | Freq: Every day | ORAL | Status: DC

## 2013-01-22 MED ORDER — DSS 100 MG PO CAPS: 100.0000 mg | ORAL_CAPSULE | Freq: Two times a day (BID) | ORAL | Status: DC

## 2013-01-22 MED ORDER — PHENYTOIN SODIUM EXTENDED 100 MG PO CAPS
100.0000 mg | ORAL_CAPSULE | Freq: Two times a day (BID) | ORAL | Status: DC
  Administered 2013-01-22: 11:00:00 100 mg via ORAL
  Filled 2013-01-22 (×2): qty 1

## 2013-01-22 MED ORDER — FUROSEMIDE 40 MG PO TABS: 40.0000 mg | ORAL_TABLET | Freq: Every day | ORAL | Status: DC

## 2013-01-22 NOTE — Progress Notes (Signed)
Clinical Social Work  Unit CSW to facilitate transfer back to SNF when medically stable. Psych CSW is signing off but available if needed.  [place], [place] [phone]

## 2013-01-22 NOTE — Progress Notes (Signed)
Patient is set to discharge back to [facility] today. Patient & niece, Diane aware. Discharge packet in [place]. PTAR scheduled for 3:30p pickup (Service Request Id: [id number]). RN, Pattricia Boss made aware.   [name] Bailey, LCSW [facility] Clinical Social Worker cell #: [phone]

## 2013-01-22 NOTE — Progress Notes (Signed)
Report called to [facility] Nurse at [employer].

## 2013-01-22 NOTE — Discharge Summary (Signed)
Physician Discharge Summary  [name] Holmes ZOX:[id number] DOB: 02-15-1925 DOA: 01/16/2013  PCP: Rayford Halsted, PT  Admit date: 01/16/2013 Discharge date: 01/22/2013  Time spent: 45 minutes  Recommendations for Outpatient Follow-up:  1. Follow up with BMP in 2 days and replete potassium as needed 2. Follow up with PCP in one week 3. Follow up with Dr Marylou Flesher in one week.  4. Follow up with Dr Jacinto Halim in 2 weeks, office will call for appointment.    Discharge Diagnoses:  Active Problems:   Acute CHF   Depression, major   Unspecified essential hypertension seizures    Discharge Condition: improved  Diet recommendation:  Dysphagia 3 (Mechanical Soft)  Liquid Administration via: Cup;Straw  Medication Administration: Whole meds with liquid  Supervision: Patient able to self feed;Intermittent supervision to cue for compensatory strategies  Compensations: Slow rate;Small sips/bites  Postural Changes and/or Swallow Maneuvers: Seated upright 90 degrees    Filed Weights   01/19/13 0531 01/21/13 0410 01/22/13 0541  Weight: 69 kg (152 lb 1.9 oz) 70 kg (154 lb 5.2 oz) 68.4 kg (150 lb 12.7 oz)    History of present illness:  79 year old male with history of stroke, seizure disorder on Dilantin, hypertension, hyperlipidemia, complete heart block s/p PPM in August 2014, presented to the ED from [facility] for eval of suicidal ideation. During this evaluation, the patient was noted to be tachypneic, and workup was undertaken which revealed the patient to be fluid overloaded. The patient relates a history of increasing shortness of breath for the past 5 days. In the ED, there was difficulty obtaining IV access. The patient had not received his intravenous furosemide at the time of my evaluation, but 40 mg IV has been ordered. Chest x-ray showed interstitial edema with proBNP of 7222. He was admitted for acute combined systolic heart failure and diuresed appropriately and cardiology consulted  for optimization of medications. He is recommended to check BMP in one week, follow up with neurology in one week and cardiology in 2 weeks.    Hospital Course:  Acute Combined Systolic and diastolic CHF  Initially admitted to telemetry  -Repeat echocardiogram done showed EF of 25% to 30 %.  -Furosemide 40 mg IV twice a day ,He became hypotensive on 10/5 and his lasix was changed from IV to po And the dose was decreased to once daily and hydralazine discontinued. Repeat blood pressures parameters have stabilized. He was started on lisinopril for his low EF,. -Strict I.'s and O.'s  -I/O last 3 completed shifts:  In: 460 [P.O.:460]  Out: 1551 [Urine:1550; Stool:1]  Total I/O  In: 120 [P.O.:120]  Out: -  - continue with Daily weights  On discharge.  -Continue metoprolol tartrate  -12/06/2012 TSH 2.897  -Cycled troponins negative.  - cardiology consulted recommended to add spironolactone. So he will be discharged on daily lasix 40 mg, metoprolol at his home dose, lisinopril at 2.5 mg daily and spironolactone. We have discontinued his amlodipine, maxzide and hydralazine. He will follow up with Dr Jacinto Halim in 2 weeks, confirmed with Dr Jacinto Halim .  Depression with suicidal ideation  - psychiatry consulted .  -Continue Lexapro which was only started on the day prior to admission  - he reports he doesnot have any suicidal thoughts.  Hypertension  -Discontinue Maxzide  - continue with metoprolol tartrate  Seizure disorder  -Continue Dilantin  - overnight he became lethargic and a CT head was obtained , it was negative for stroke,. NP discussed with Dr Roseanne Reno  and his dilantin level was obtained. His level was subtherapeutic after albumin correction. He was given an extra dose of IV dilantin and resumed the po dilantin in am. Repeat levels in am were therapeutic. He was resumed on his home dose of the medication. He is recommended to follow up with Dr Golden Hurter in one week.  History of stroke  -  Continue aspirin 81 mg daily    Procedures: Echocardiogram There is akinesis of the basal and mid anterior and anteroseptal walls, apical septal and anterior walls as well as apical inferir wall. The cavity size was normal. Systolic function was severely reduced. The estimated ejection fraction was in the range of 25% to 30%. Doppler parameters are consistent with abnormal left ventricular relaxation (grade 1 diastolic dysfunction    Consultations: Cardiology Dr Jacinto Halim. Dr Roseanne Reno Neurology over the phone  Discharge Exam: Filed Vitals:   01/22/13 0541  BP: 138/78  Pulse: 65  Temp: 97.5 F (36.4 C)  Resp: 18    General: Alert afebrile comfortable  Cardiovascular: s1s2  Respiratory: CTAB  Abdomen: soft NT ND BS+  Musculoskeletal: no pedal edema  Discharge Instructions   Future Appointments Provider Department Dept Phone   03/12/2013 2:00 PM Marinus Maw, MD [facility] [place] Office [phone]   10/10/2013 1:30 PM Ronal Fear, NP [facility] [phone]       Medication List    STOP taking these medications       amLODipine 5 MG tablet  Commonly known as:  NORVASC     hydrALAZINE 50 MG tablet  Commonly known as:  APRESOLINE     triamterene-hydrochlorothiazide 37.5-25 MG per tablet  Commonly known as:  MAXZIDE-25      TAKE these medications       albuterol 108 (90 BASE) MCG/ACT inhaler  Commonly known as:  PROVENTIL HFA;VENTOLIN HFA  Inhale 2 puffs into the lungs 2 (two) times daily.     albuterol (2.5 MG/3ML) 0.083% nebulizer solution  Commonly known as:  PROVENTIL  Take 2.5 mg by nebulization every 6 (six) hours as needed for shortness of breath.     aspirin 81 MG EC tablet  Take 1 tablet (81 mg total) by mouth daily.     Calcium-Vitamin D 600-200 MG-UNIT per tablet  Take 1 tablet by mouth 2 (two) times daily.     DSS 100 MG Caps  Take 100 mg by mouth 2 (two) times daily.     escitalopram 10 MG tablet  Commonly  known as:  LEXAPRO  Take 10 mg by mouth daily.     folic acid 1 MG tablet  Commonly known as:  FOLVITE  Take 1 mg by mouth every morning.     furosemide 40 MG tablet  Commonly known as:  LASIX  Take 1 tablet (40 mg total) by mouth daily.     lisinopril 2.5 MG tablet  Commonly known as:  PRINIVIL,ZESTRIL  Take 1 tablet (2.5 mg total) by mouth daily.     metoprolol 50 MG tablet  Commonly known as:  LOPRESSOR  Take 1 tablet (50 mg total) by mouth 2 (two) times daily.     phenytoin 100 MG ER capsule  Commonly known as:  DILANTIN  Take 1 capsule (100 mg total) by mouth 2 (two) times daily.     polyethylene glycol packet  Commonly known as:  MIRALAX / GLYCOLAX  Take 17 g by mouth daily as needed.     pravastatin 40 MG tablet  Commonly known as:  PRAVACHOL  Take 40 mg by mouth at bedtime.     sennosides-docusate sodium 8.6-50 MG tablet  Commonly known as:  SENOKOT-S  Take 2 tablets by mouth at bedtime as needed for constipation.     spironolactone 25 MG tablet  Commonly known as:  ALDACTONE  Take 1 tablet (25 mg total) by mouth daily.       Allergies  Allergen Reactions  . Risedronate Sodium Other (See Comments)    Severe headache  . Penicillins Other (See Comments)  . Pollen Extract Other (See Comments)      The results of significant diagnostics from this hospitalization (including imaging, microbiology, ancillary and laboratory) are listed below for reference.    Significant Diagnostic Studies: Dg Chest 2 View  01/19/2013   CLINICAL DATA:  Shortness of breath and chest pain.  EXAM: CHEST  2 VIEW  COMPARISON:  01/16/2013 and 11/08/2010  FINDINGS: Left-sided pacemaker unchanged. The patient is slightly rotated to the left. There is bibasilar interstitial disease which is chronic. There is mild bibasilar opacification likely small effusions with atelectasis unchanged as cannot completely exclude infection in the lung bases. Cardiomediastinal silhouette and remainder of  the exam is unchanged to include a severe compression fracture over the lower thoracic spine.  IMPRESSION: Bibasilar opacification compatible with small effusions likely with associated atelectasis. Cannot completely exclude infection in the lung bases.   Electronically Signed   By: Elberta Fortis M.D.   On: 01/19/2013 17:04   Dg Chest 2 View  01/16/2013   CLINICAL DATA:  Shortness of breath.  EXAM: CHEST  2 VIEW  COMPARISON:  01/05/2013 and 02/25/2011  FINDINGS: Dual lead pacer in place. Heart size is normal but there is pulmonary vascular congestion superimposed on fairly severe chronic interstitial lung disease. Small bilateral pleural effusions.  No acute osseous abnormality. Old well-healed right clavicle fracture.  IMPRESSION: 1. New pulmonary vascular congestion. 2. Chronic interstitial lung disease. I suspect there is slight interstitial edema superimposed on the chronic lung disease. 3. Small bilateral pleural effusions.   Electronically Signed   By: Geanie Cooley   On: 01/16/2013 15:05   Ct Head Wo Contrast  01/20/2013   *RADIOLOGY REPORT*  Clinical Data: Decreased level of consciousness; severe lethargy.  CT HEAD WITHOUT CONTRAST  Technique:  Contiguous axial images were obtained from the base of the skull through the vertex without contrast.  Comparison: CT of the head performed 12/03/2012  Findings: There is no evidence of acute infarction, mass lesion, or intra- or extra-axial hemorrhage on CT.  There is relatively stable severe ventriculomegaly, with underlying prominence of the sulci.  There is no evidence of underlying obstructive lesion.  Periventricular and subcortical white matter change likely reflects small vessel ischemic microangiopathy.  The posterior fossa, including the cerebellum, brainstem and fourth ventricle, is within normal limits.  The cerebral hemispheres demonstrate grossly normal gray-white differentiation.  No midline shift is seen.  There is no evidence of fracture;  visualized osseous structures are unremarkable in appearance.  The orbits are within normal limits. The paranasal sinuses and mastoid air cells are well-aerated.  No significant soft tissue abnormalities are seen.  IMPRESSION:  1.  No acute intracranial pathology seen on CT. 2.  Relatively stable severe ventriculomegaly, without definite evidence of underlying obstruction. 3.  Small vessel ischemic microangiopathy.   Original Report Authenticated By: Tonia Ghent, M.D.    Microbiology: No results found for this or any previous visit (from the past 240 hour(s)).  Labs: Basic Metabolic Panel:  Recent Labs Lab 01/17/13 0423 01/18/13 0353 01/19/13 0454 01/21/13 0420 01/22/13 0403  NA 139 137 135 134* 133*  K 3.5 3.3* 3.6 3.6 4.1  CL 102 99 99 98 96  CO2 28 27 27 27 27   GLUCOSE 105* 94 92 110* 113*  BUN 20 21 22  38* 34*  CREATININE 1.06 1.15 1.14 1.50* 1.21  CALCIUM 8.9 9.2 9.5 8.9 9.5  MG  --   --  1.7  --   --    Liver Function Tests:  Recent Labs Lab 01/16/13 1423 01/22/13 0403  AST 15 15  ALT 12 12  ALKPHOS 128* 128*  BILITOT 0.2* 0.4  PROT 6.4 6.4  ALBUMIN 3.0* 2.9*   No results found for this basename: LIPASE, AMYLASE,  in the last 168 hours No results found for this basename: AMMONIA,  in the last 168 hours CBC:  Recent Labs Lab 01/16/13 1423  WBC 8.4  HGB 13.9  HCT 40.4  MCV 101.0*  PLT 257   Cardiac Enzymes:  Recent Labs Lab 01/16/13 1745 01/16/13 2210 01/17/13 0423  TROPONINI <0.30 <0.30 <0.30   BNP: BNP (last 3 results)  Recent Labs  01/16/13 1423 01/22/13 0403  PROBNP 7222.0* 2875.0*   CBG: No results found for this basename: GLUCAP,  in the last 168 hours     Signed:  Rithika Seel  [facility] 01/22/2013, 11:20 AM

## 2013-01-24 ENCOUNTER — Telehealth: Payer: Self-pay | Admitting: Neurology

## 2013-01-25 NOTE — Telephone Encounter (Signed)
Place into next tuesday 12  noon - my lunch break . CD -   It would be OK with Larita Fife, I would be supervising physician.  Is she able to see him Tuesday ??

## 2013-01-25 NOTE — Telephone Encounter (Signed)
completed

## 2013-01-25 NOTE — Telephone Encounter (Signed)
Larita Fife from [facility] is calling to schedule one week f/u w/ Dr. Vickey Huger as instructed in ED summary. Advised would consult w/ Dr. Vickey Huger due to limited schedule availability for next week. She agreed.

## 2013-02-11 ENCOUNTER — Encounter (INDEPENDENT_AMBULATORY_CARE_PROVIDER_SITE_OTHER): Payer: Self-pay

## 2013-02-11 ENCOUNTER — Ambulatory Visit (INDEPENDENT_AMBULATORY_CARE_PROVIDER_SITE_OTHER): Payer: Medicare Other | Admitting: Neurology

## 2013-02-11 ENCOUNTER — Encounter: Payer: Self-pay | Admitting: Neurology

## 2013-02-11 VITALS — BP 95/63 | HR 59

## 2013-02-11 DIAGNOSIS — Z950 Presence of cardiac pacemaker: Secondary | ICD-10-CM

## 2013-02-11 DIAGNOSIS — M810 Age-related osteoporosis without current pathological fracture: Secondary | ICD-10-CM

## 2013-02-11 DIAGNOSIS — I5042 Chronic combined systolic (congestive) and diastolic (congestive) heart failure: Secondary | ICD-10-CM

## 2013-02-11 DIAGNOSIS — T50904A Poisoning by unspecified drugs, medicaments and biological substances, undetermined, initial encounter: Secondary | ICD-10-CM

## 2013-02-11 DIAGNOSIS — R569 Unspecified convulsions: Secondary | ICD-10-CM

## 2013-02-11 DIAGNOSIS — G620 Drug-induced polyneuropathy: Secondary | ICD-10-CM

## 2013-02-11 DIAGNOSIS — I509 Heart failure, unspecified: Secondary | ICD-10-CM

## 2013-02-11 HISTORY — DX: Presence of cardiac pacemaker: Z95.0

## 2013-02-11 NOTE — Addendum Note (Signed)
Addended by: Melvyn Novas on: 02/11/2013 12:33 PM   Modules accepted: Orders

## 2013-02-11 NOTE — Progress Notes (Signed)
[facility]  Provider:  Melvyn Novas, M D  Referring Provider: No ref. provider found Primary Care Physician:  Rayford Halsted, PT  Chief Complaint  Patient presents with  . Hospital F/U    Seizures disorder Hx of stroke    HPI:  [name] Holmes is a 79 y.o. male  Is seen here as a referral/ revisit  from a recent hospitalization. He is an establsihed   [name] is an established patient of [facility], who recently was admitted to hospital in August 2014, after EMS picked him up ay home after a collapse.  He was diagnosed with abnormal Bradycardia, and a pacemaker placed - Dr. Jacinto Halim and Dr. Ladona Ridgel  followed him.   He was admitted to hospital again  after he was originally meant to be evaluated for his severe depression. This all occurred during a stay at the [facility] nursing home, where he was admitted for rehabilitation after the pacemaker placement. He had also speech, physical and occupational therapy. In the emergency room at about that the patient had retained a lot of fluid lately due to his diuretics have been discontinued about a month earlier in late August while in hospital. He presented now with CHF and needed  Diuretic therapy again. He begun feeling better, and a psychiatric evaluation followed several days later, findig him not longer suicidal.  He was re -evaluated at [facility] nursing home after this second hospitalization , when his nieces ( and [place]) learnt he may have had some spells , named "absences" , that nursing staff noted. No other seizure activity was noted and [name] remains seizure free since 1997 - see previous chart from  10-04-12.    Review of Systems: Out of a complete 14 system review, the patient complains of only the following symptoms, and all other reviewed systems are negative. Passing out in July 2014, EMS ride to [place], admitted for pacemaker.  And later readmitted  for CHF and fluid  built  up, SOB, edema. The patient thought  he had asthma. Nausea, congestion.   History   Social History  . Marital Status: Widowed    Spouse Name: N/A    Number of Children: N/A  . Years of Education: N/A   Occupational History  . retired     former [employer] employee   Social History Main Topics  . Smoking status: Never Smoker   . Smokeless tobacco: Never Used  . Alcohol Use: No  . Drug Use: No  . Sexual Activity: No   Other Topics Concern  . Not on file   Social History Narrative  . No narrative on file    No family history on file.  Past Medical History  Diagnosis Date  . Stroke 2005  . Hypertension   . Hyperlipidemia   . Prostate cancer     radiation  . 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 Seizures 10/04/2012    Remote , last seizure in 1994. On Dilantin  100 mg bd po.  . Depression   . Dementia   . Acute CHF 01/16/2013    Past Surgical History  Procedure Laterality Date  . Prostate biopsy  2003  . Appendectomy  1939  . Pacemaker insertion  12/03/2012    St. Jude Assurity dual-chamber pacemaker, serial number [id number]    Current Outpatient Prescriptions  Medication Sig Dispense Refill  . albuterol (PROVENTIL) (2.5 MG/3ML) 0.083% nebulizer solution Take 2.5 mg by nebulization every 6 (six) hours  as needed for shortness of breath.      Marland Kitchen aspirin EC 81 MG EC tablet Take 1 tablet (81 mg total) by mouth daily.  30 tablet  1  . Calcium-Vitamin D 600-200 MG-UNIT per tablet Take 1 tablet by mouth 2 (two) times daily.        Marland Kitchen docusate sodium 100 MG CAPS Take 100 mg by mouth 2 (two) times daily.  10 capsule  0  . escitalopram (LEXAPRO) 10 MG tablet Take 10 mg by mouth daily.      . folic acid (FOLVITE) 1 MG tablet Take 1 mg by mouth every morning.        . furosemide (LASIX) 40 MG tablet Take 1 tablet (40 mg total) by mouth daily.  30 tablet    . lisinopril (PRINIVIL,ZESTRIL) 2.5 MG tablet Take 1 tablet (2.5 mg total) by mouth daily.      . metoprolol (LOPRESSOR) 50 MG tablet Take 1 tablet (50 mg  total) by mouth 2 (two) times daily.  60 tablet  0  . phenytoin (DILANTIN) 100 MG ER capsule Take 1 capsule (100 mg total) by mouth 2 (two) times daily.  180 capsule  3  . polyethylene glycol (MIRALAX / GLYCOLAX) packet Take 17 g by mouth daily as needed.  14 each  0  . pravastatin (PRAVACHOL) 40 MG tablet Take 40 mg by mouth at bedtime.      Marland Kitchen spironolactone (ALDACTONE) 25 MG tablet Take 1 tablet (25 mg total) by mouth daily.      Marland Kitchen albuterol (PROVENTIL HFA;VENTOLIN HFA) 108 (90 BASE) MCG/ACT inhaler Inhale 2 puffs into the lungs 2 (two) times daily.      . sennosides-docusate sodium (SENOKOT-S) 8.6-50 MG tablet Take 2 tablets by mouth at bedtime as needed for constipation.       No current facility-administered medications for this visit.    Allergies as of 02/11/2013 - Review Complete 02/11/2013  Allergen Reaction Noted  . Risedronate sodium Other (See Comments) 02/25/2011  . Penicillins Other (See Comments) 10/04/2012  . Pollen extract Other (See Comments) 10/04/2012    Vitals: BP 95/63  Pulse 59 Last Weight:  Wt Readings from Last 1 Encounters:  01/22/13 150 lb 12.7 oz (68.4 kg)   Last Height:   Ht Readings from Last 1 Encounters:  01/16/13 5\' 7"  (1.702 m)    Physical exam:  General: The patient is awake, alert and appears not in acute distress. The patient is well groomed. Head: Normocephalic, atraumatic. Neck is supple. Mallampati 3, neck circumference: 14. Cardiovascular: bradycardia resolved - 58  bpm, , without  murmurs or carotid bruit, and without distended neck veins. Respiratory: Lungs are clear to auscultation. Skin:  Without evidence of edema, or rash  Cranial nerves:  Pupils are equal and briskly reactive to light. The patient rests with his mouth open, Right eye esotropia- Extraocular movements intact but not conjugate , without nystagmus. Visual fields by finger perimetry are intact.  Hearing to finger rub intact. Facial sensation intact to fine touch.  Facial motor strength, left lower facial droop, tongue and uvula move midline.  Motor exam: Normal Grip Strength on the right, Left hand is not able to extend , contracture since He was 79 years of age. Otherwise tone and muscle bulk normal, and symmetricl strength in both legs.  Sensory: Fine touch, pinprick and vibration were Decreased in both feet and mid calf level. Proprioception is tested in the upper extremities was normal.  Coordination: Rapid alternating movements  in the fingers/handswere deferred due to the disability in his left hand, and the Rapid movements were not possible in the righ ( contracture) t. Finger-to-nose maneuver tested and normal without evidence of ataxia, dysmetria or tremor on the right .  Gait and station: Patient walks Slowly with assistive device and is leaning heavily on his walker.  Deep tendon reflexes: in the upper and lower extremities are attenuated.   Assessment: After physical and neurologic examination,  and pre-existing records.  The long time patient of [facility] presents today for a routine revisit, he still continues to take Dilantin twice daily and I discussed with them that after 20 years of seizure-free time, it will be possible to initiate a slow weaning process. I explained the long-term effects of Dilantin on his body which has already taken place about not likely to revert. I am concerned however that the Phenytoin has contributed to an antidromic affect and could be a actor in his need  for a pacemaker.  He ate less and had retained fluid and had very, very  variable levels.  The patient decided he would rather continue taking the medication which is a valid choice. I would like to reduce phenytoin and start lamictal - a choice that his POA did not like much.  We discussed ethosuximide ,  He is using his walker but still had falls inside his home, where his walker cannot be used. He came today in a wheelchair.  His niece  coordinated healthcare but does not live in the same place. He is currently at [facility] nursing home, in skilled care .   Plan: Treatment plan and additional workup will be reviewed under Problem List.  I refill the Dilantin will order a Dilantin level for this patient today free and bound. We discussed long if change form Dilantin should be undertaken and patient and niece are reluctant.   Reviewing Dr. Jon Gills and [facility] laboratory results I do not think that we need to repeat a metabolic panel.

## 2013-02-28 ENCOUNTER — Encounter: Payer: Self-pay | Admitting: *Deleted

## 2013-03-01 ENCOUNTER — Inpatient Hospital Stay (HOSPITAL_COMMUNITY)
Admission: EM | Admit: 2013-03-01 | Discharge: 2013-03-06 | DRG: 871 | Disposition: A | Discharge status: Skilled Nursing Facility | Payer: Medicare Other | Attending: Internal Medicine | Admitting: Internal Medicine

## 2013-03-01 ENCOUNTER — Emergency Department (HOSPITAL_COMMUNITY): Payer: Medicare Other

## 2013-03-01 ENCOUNTER — Encounter (HOSPITAL_COMMUNITY): Payer: Self-pay | Admitting: Emergency Medicine

## 2013-03-01 DIAGNOSIS — N10 Acute tubulo-interstitial nephritis: Secondary | ICD-10-CM | POA: Diagnosis present

## 2013-03-01 DIAGNOSIS — Z7982 Long term (current) use of aspirin: Secondary | ICD-10-CM

## 2013-03-01 DIAGNOSIS — Z22322 Carrier or suspected carrier of Methicillin resistant Staphylococcus aureus: Secondary | ICD-10-CM

## 2013-03-01 DIAGNOSIS — Z8673 Personal history of transient ischemic attack (TIA), and cerebral infarction without residual deficits: Secondary | ICD-10-CM

## 2013-03-01 DIAGNOSIS — A419 Sepsis, unspecified organism: Principal | ICD-10-CM | POA: Diagnosis present

## 2013-03-01 DIAGNOSIS — Z8546 Personal history of malignant neoplasm of prostate: Secondary | ICD-10-CM

## 2013-03-01 DIAGNOSIS — F0390 Unspecified dementia without behavioral disturbance: Secondary | ICD-10-CM | POA: Diagnosis present

## 2013-03-01 DIAGNOSIS — G620 Drug-induced polyneuropathy: Secondary | ICD-10-CM | POA: Diagnosis present

## 2013-03-01 DIAGNOSIS — F3289 Other specified depressive episodes: Secondary | ICD-10-CM | POA: Diagnosis present

## 2013-03-01 DIAGNOSIS — E785 Hyperlipidemia, unspecified: Secondary | ICD-10-CM | POA: Diagnosis present

## 2013-03-01 DIAGNOSIS — N179 Acute kidney failure, unspecified: Secondary | ICD-10-CM | POA: Diagnosis present

## 2013-03-01 DIAGNOSIS — M810 Age-related osteoporosis without current pathological fracture: Secondary | ICD-10-CM

## 2013-03-01 DIAGNOSIS — Z950 Presence of cardiac pacemaker: Secondary | ICD-10-CM

## 2013-03-01 DIAGNOSIS — Z880 Allergy status to penicillin: Secondary | ICD-10-CM

## 2013-03-01 DIAGNOSIS — Z823 Family history of stroke: Secondary | ICD-10-CM

## 2013-03-01 DIAGNOSIS — G40909 Epilepsy, unspecified, not intractable, without status epilepticus: Secondary | ICD-10-CM | POA: Diagnosis present

## 2013-03-01 DIAGNOSIS — I5042 Chronic combined systolic (congestive) and diastolic (congestive) heart failure: Secondary | ICD-10-CM | POA: Diagnosis present

## 2013-03-01 DIAGNOSIS — Z66 Do not resuscitate: Secondary | ICD-10-CM | POA: Diagnosis present

## 2013-03-01 DIAGNOSIS — D72829 Elevated white blood cell count, unspecified: Secondary | ICD-10-CM | POA: Diagnosis present

## 2013-03-01 DIAGNOSIS — F329 Major depressive disorder, single episode, unspecified: Secondary | ICD-10-CM | POA: Diagnosis present

## 2013-03-01 DIAGNOSIS — N390 Urinary tract infection, site not specified: Secondary | ICD-10-CM | POA: Diagnosis present

## 2013-03-01 DIAGNOSIS — I509 Heart failure, unspecified: Secondary | ICD-10-CM | POA: Diagnosis present

## 2013-03-01 DIAGNOSIS — R569 Unspecified convulsions: Secondary | ICD-10-CM | POA: Diagnosis present

## 2013-03-01 DIAGNOSIS — I10 Essential (primary) hypertension: Secondary | ICD-10-CM | POA: Diagnosis present

## 2013-03-01 LAB — CG4 I-STAT (LACTIC ACID): Lactic Acid, Venous: 2.71 mmol/L — ABNORMAL HIGH (ref 0.5–2.2)

## 2013-03-01 LAB — COMPREHENSIVE METABOLIC PANEL
ALT: 11 U/L (ref 0–53)
AST: 15 U/L (ref 0–37)
Albumin: 2.6 g/dL — ABNORMAL LOW (ref 3.5–5.2)
Alkaline Phosphatase: 118 U/L — ABNORMAL HIGH (ref 39–117)
BUN: 36 mg/dL — ABNORMAL HIGH (ref 6–23)
CO2: 24 mEq/L (ref 19–32)
Chloride: 94 mEq/L — ABNORMAL LOW (ref 96–112)
Creatinine, Ser: 1.74 mg/dL — ABNORMAL HIGH (ref 0.50–1.35)
Potassium: 4 mEq/L (ref 3.5–5.1)
Sodium: 131 mEq/L — ABNORMAL LOW (ref 135–145)
Total Bilirubin: 0.3 mg/dL (ref 0.3–1.2)
Total Protein: 6.7 g/dL (ref 6.0–8.3)

## 2013-03-01 LAB — CBC WITH DIFFERENTIAL/PLATELET
Basophils Absolute: 0 10*3/uL (ref 0.0–0.1)
Basophils Relative: 0 % (ref 0–1)
Eosinophils Absolute: 0 10*3/uL (ref 0.0–0.7)
HCT: 35 % — ABNORMAL LOW (ref 39.0–52.0)
Lymphocytes Relative: 4 % — ABNORMAL LOW (ref 12–46)
MCH: 34.3 pg — ABNORMAL HIGH (ref 26.0–34.0)
MCHC: 35.1 g/dL (ref 30.0–36.0)
Neutro Abs: 20.4 10*3/uL — ABNORMAL HIGH (ref 1.7–7.7)
Neutrophils Relative %: 86 % — ABNORMAL HIGH (ref 43–77)
Platelets: 284 10*3/uL (ref 150–400)
RDW: 13 % (ref 11.5–15.5)

## 2013-03-01 MED ORDER — DEXTROSE 5 % IV SOLN
1.0000 g | Freq: Three times a day (TID) | INTRAVENOUS | Status: DC
  Filled 2013-03-01 (×2): qty 1

## 2013-03-01 MED ORDER — SODIUM CHLORIDE 0.9 % IV BOLUS (SEPSIS)
1000.0000 mL | Freq: Once | INTRAVENOUS | Status: AC
  Administered 2013-03-01: 1000 mL via INTRAVENOUS

## 2013-03-01 MED ORDER — FENTANYL CITRATE 0.05 MG/ML IJ SOLN
50.0000 ug | Freq: Once | INTRAMUSCULAR | Status: AC
  Administered 2013-03-01: 50 ug via INTRAVENOUS
  Filled 2013-03-01: qty 2

## 2013-03-01 MED ORDER — LEVOFLOXACIN IN D5W 750 MG/150ML IV SOLN: 750.0000 mg | INTRAVENOUS | Status: DC

## 2013-03-01 MED ORDER — SODIUM CHLORIDE 0.9 % IV SOLN
Freq: Once | INTRAVENOUS | Status: AC
  Administered 2013-03-01: 22:00:00 via INTRAVENOUS

## 2013-03-01 MED ORDER — ACETAMINOPHEN 650 MG RE SUPP
650.0000 mg | Freq: Once | RECTAL | Status: AC
  Administered 2013-03-01: 650 mg via RECTAL
  Filled 2013-03-01: qty 1

## 2013-03-01 MED ORDER — VANCOMYCIN HCL IN DEXTROSE 1-5 GM/200ML-% IV SOLN
1000.0000 mg | INTRAVENOUS | Status: DC
  Administered 2013-03-02 – 2013-03-03 (×2): 1000 mg via INTRAVENOUS
  Filled 2013-03-01 (×4): qty 200

## 2013-03-01 MED ORDER — LEVOFLOXACIN IN D5W 750 MG/150ML IV SOLN
750.0000 mg | Freq: Once | INTRAVENOUS | Status: DC
  Administered 2013-03-02: 750 mg via INTRAVENOUS
  Filled 2013-03-01: qty 150

## 2013-03-01 MED ORDER — AZTREONAM 2 G IJ SOLR
2.0000 g | INTRAMUSCULAR | Status: DC
  Administered 2013-03-02: 2 g via INTRAVENOUS
  Filled 2013-03-01: qty 2

## 2013-03-01 MED ORDER — VANCOMYCIN HCL IN DEXTROSE 1-5 GM/200ML-% IV SOLN
1000.0000 mg | Freq: Once | INTRAVENOUS | Status: AC
  Administered 2013-03-01: 1000 mg via INTRAVENOUS
  Filled 2013-03-01: qty 200

## 2013-03-01 MED ORDER — SODIUM CHLORIDE 0.9 % IV SOLN
1000.0000 mg | Freq: Once | INTRAVENOUS | Status: AC
  Administered 2013-03-01: 1000 mg via INTRAVENOUS
  Filled 2013-03-01: qty 10

## 2013-03-01 NOTE — Progress Notes (Signed)
ANTIBIOTIC CONSULT NOTE - INITIAL  Pharmacy Consult for Vancomycin/Aztreonam/Levaquin  Indication: rule out sepsis, ?source  Allergies  Allergen Reactions  . Risedronate Sodium Other (See Comments)    Severe headache  . Penicillins Other (See Comments)  . Pollen Extract Other (See Comments)   Patient Measurements: 68.4 kg  Vital Signs: Temp: 102.1 F (38.9 C) (11/14 2058) Temp src: Rectal (11/14 2058) BP: 101/43 mmHg (11/14 2300) Pulse Rate: 78 (11/14 2300) Labs:  Recent Labs  03/01/13 2116  WBC 23.9*  HGB 12.3*  PLT 284  CREATININE 1.74*   Medical History: Past Medical History  Diagnosis Date  . Stroke 2005  . Hypertension   . Hyperlipidemia   . Prostate cancer     radiation  . 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 Seizures 10/04/2012    Remote , last seizure in 1994. On Dilantin  100 mg bd po.  . Depression   . Dementia   . Acute CHF 01/16/2013    Dr tat , dr Ladona Ridgel.   . Cardiac pacemaker in situ 02/11/2013   Assessment: 79 y/o NH patient here with fever/leukocytosis to start empiric antibiotic coverage.   ED Antibiotics Vanco x 1 Aztreonam x 1 Levaquin x 1  Goal of Therapy:  Vancomycin trough level 15-20 mcg/ml  Plan:  -Vancomycin 1000 mg IV q24h -Aztreonam 1g IV q8h -Levaquin 750 mg IV q48h -Trend WBC, temp, renal function  -F/U cultures, imaging -Drug levels as indicated  Thank you for allowing me to take part in this patient's care,  [name] Holmes, PharmD Clinical Pharmacist Phone: [phone] Pager: [phone] 03/01/2013 11:17 PM

## 2013-03-01 NOTE — ED Notes (Signed)
Pt from [facility]- has low grade fever, generalized body aches and lethargy. WBC 26.2 pt has baseline of dementia.

## 2013-03-01 NOTE — ED Notes (Signed)
Report attempted x 1

## 2013-03-01 NOTE — ED Provider Notes (Signed)
CSN: [id number]     Arrival date & time 03/01/13  2048 History   First MD Initiated Contact with Patient 03/01/13 2051     Chief Complaint  Patient presents with  . Fever   (Consider location/radiation/quality/duration/timing/severity/associated sxs/prior Treatment) HPI Comments: Patient is a 79 year old male with history of prior stroke, hypertension, hyperlipidemia, prostate cancer, seizures, depression, dementia, congestive heart failure who presents today from [name] nursing home for a low-grade fever and elevated white count. They report that he is mentating at his baseline. He is complaining of generalized body aches. He seizes several times during the exam. He is oriented to person. He believes he is at [facility], that it is Sunday, and that the month is October. When the daughter arrived she is able to tell us that he was given a shot of Rocephin today for a suspected urinary tract infection. He has been mentating at his baseline, but seems to be declining in his condition daily. She is his power of attorney and confirms that he is a DO NOT RESUSCITATE/DO NOT INTUBATE.  Patient is a 79 y.o. male presenting with fever. The history is provided by the patient and the nursing home. No language interpreter was used.  Fever   Past Medical History  Diagnosis Date  . Stroke 2005  . Hypertension   . Hyperlipidemia   . Prostate cancer     radiation  . 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 Seizures 10/04/2012    Remote , last seizure in 1994. On Dilantin  100 mg bd po.  . Depression   . Dementia   . Acute CHF 01/16/2013    Dr tat , dr Ladona Ridgel.   . Cardiac pacemaker in situ 02/11/2013   Past Surgical History  Procedure Laterality Date  . Prostate biopsy  2003  . Appendectomy  1939  . Pacemaker insertion  12/03/2012    St. Jude Assurity dual-chamber pacemaker, serial number [id number]   Family History  Problem Relation Age of Onset  . Bipolar disorder Sister 81   psychotic episodes, 4098,11,91 , 98.  . Stroke Mother 62    CVA  . Stroke Father 37    CVA , alcohol   History  Substance Use Topics  . Smoking status: Never Smoker   . Smokeless tobacco: Never Used  . Alcohol Use: No    Review of Systems  Unable to perform ROS: Dementia  Constitutional: Positive for fever.    Allergies  Risedronate sodium; Penicillins; and Pollen extract  Home Medications   Current Outpatient Rx  Name  Route  Sig  Dispense  Refill  . acetaminophen (TYLENOL) 500 MG tablet   Oral   Take 1,000 mg by mouth every 6 (six) hours as needed for mild pain.         Marland Kitchen albuterol (PROVENTIL HFA;VENTOLIN HFA) 108 (90 BASE) MCG/ACT inhaler   Inhalation   Inhale 2 puffs into the lungs 2 (two) times daily.         Marland Kitchen albuterol (PROVENTIL) (2.5 MG/3ML) 0.083% nebulizer solution   Nebulization   Take 2.5 mg by nebulization every 6 (six) hours as needed for shortness of breath.         Marland Kitchen aspirin EC 81 MG tablet   Oral   Take 81 mg by mouth daily.         . Calcium-Vitamin D 600-200 MG-UNIT per tablet   Oral   Take 1 tablet by mouth 2 (two) times daily.           Marland Kitchen  cefTRIAXone (ROCEPHIN) 1 G injection   Intramuscular   Inject 1 g into the muscle See admin instructions. *inject 1gm daily for 3 days*         . docusate sodium (COLACE) 100 MG capsule   Oral   Take 100 mg by mouth 2 (two) times daily as needed for mild constipation.         Marland Kitchen escitalopram (LEXAPRO) 10 MG tablet   Oral   Take 10 mg by mouth daily.         . folic acid (FOLVITE) 1 MG tablet   Oral   Take 1 mg by mouth every morning.           . furosemide (LASIX) 40 MG tablet   Oral   Take 40 mg by mouth daily.         Marland Kitchen guaiFENesin (ROBITUSSIN) 100 MG/5ML SOLN   Oral   Take 300 mg by mouth every 4 (four) hours as needed for cough or to loosen phlegm.         Marland Kitchen lisinopril (PRINIVIL,ZESTRIL) 2.5 MG tablet   Oral   Take 2.5 mg by mouth daily.         . metoprolol  (LOPRESSOR) 50 MG tablet   Oral   Take 50 mg by mouth 2 (two) times daily.         . phenytoin (DILANTIN) 100 MG ER capsule   Oral   Take 100 mg by mouth 2 (two) times daily.         . polyethylene glycol (MIRALAX / GLYCOLAX) packet   Oral   Take 17 g by mouth daily as needed for mild constipation.         . pravastatin (PRAVACHOL) 40 MG tablet   Oral   Take 40 mg by mouth at bedtime.         . sennosides-docusate sodium (SENOKOT-S) 8.6-50 MG tablet   Oral   Take 2 tablets by mouth at bedtime as needed for constipation.         Marland Kitchen spironolactone (ALDACTONE) 25 MG tablet   Oral   Take 25 mg by mouth daily.         Marland Kitchen triamterene-hydrochlorothiazide (MAXZIDE-25) 37.5-25 MG per tablet   Oral   Take 1 tablet by mouth daily.          BP 116/48  Pulse 78  Temp(Src) 102.1 F (38.9 C) (Rectal)  Resp 19  SpO2 96% Physical Exam  Nursing note and vitals reviewed. Constitutional: He appears well-developed and well-nourished. He has a sickly appearance. He appears ill. He appears distressed.  HENT:  Head: Normocephalic and atraumatic.  Right Ear: External ear normal.  Left Ear: External ear normal.  Nose: Nose normal.  Mouth/Throat: Uvula is midline. Mucous membranes are dry.  Eyes: Conjunctivae are normal.  Neck: Normal range of motion. No tracheal deviation present.  Cardiovascular: Normal rate, regular rhythm and normal heart sounds.   Pulmonary/Chest: Effort normal and breath sounds normal. No stridor.  Abdominal: Soft. He exhibits no distension. There is generalized tenderness. There is no rigidity and no guarding.  Musculoskeletal: Normal range of motion.  Neurological: He is alert.  Oriented to person. Follows commands  Skin: Skin is warm and dry. He is not diaphoretic.  Psychiatric: He has a normal mood and affect. His behavior is normal.    ED Course  Procedures (including critical care time) Labs Review Labs Reviewed  CBC WITH DIFFERENTIAL -  Abnormal; Notable for the  following:    WBC 23.9 (*)    RBC 3.59 (*)    Hemoglobin 12.3 (*)    HCT 35.0 (*)    MCH 34.3 (*)    Neutrophils Relative % 86 (*)    Neutro Abs 20.4 (*)    Lymphocytes Relative 4 (*)    Monocytes Absolute 2.4 (*)    All other components within normal limits  COMPREHENSIVE METABOLIC PANEL - Abnormal; Notable for the following:    Sodium 131 (*)    Chloride 94 (*)    Glucose, Bld 167 (*)    BUN 36 (*)    Creatinine, Ser 1.74 (*)    Albumin 2.6 (*)    Alkaline Phosphatase 118 (*)    GFR calc non Af Amer 34 (*)    GFR calc Af Amer 39 (*)    All other components within normal limits  URINALYSIS, ROUTINE W REFLEX MICROSCOPIC - Abnormal; Notable for the following:    APPearance TURBID (*)    Hgb urine dipstick MODERATE (*)    Protein, ur 30 (*)    Leukocytes, UA LARGE (*)    All other components within normal limits  URINE MICROSCOPIC-ADD ON - Abnormal; Notable for the following:    Bacteria, UA MANY (*)    All other components within normal limits  GLUCOSE, CAPILLARY - Abnormal; Notable for the following:    Glucose-Capillary 166 (*)    All other components within normal limits  CG4 I-STAT (LACTIC ACID) - Abnormal; Notable for the following:    Lactic Acid, Venous 2.71 (*)    All other components within normal limits  CULTURE, BLOOD (ROUTINE X 2)  CULTURE, BLOOD (ROUTINE X 2)  URINE CULTURE  MRSA PCR SCREENING  CBC  BASIC METABOLIC PANEL  LACTIC ACID, PLASMA   Imaging Review Dg Chest Port 1 View  03/01/2013   CLINICAL DATA:  Fever.  EXAM: PORTABLE CHEST - 1 VIEW  COMPARISON:  01/19/2013.  FINDINGS: Left subclavian pacemaker lead tips project over the right atrium and right ventricle. Trachea is midline. Heart size stable. There is a coarsened appearance of the lung bases, as on multiple prior exams. No airspace consolidation or pleural fluid. Old right clavicle fracture.  IMPRESSION: 1. Bibasilar pulmonary parenchymal coarsening appears chronic and  may be due to fibrosis. 2. No acute findings.   Electronically Signed   By: Leanna Battles M.D.   On: 03/01/2013 21:32    EKG Interpretation     Ventricular Rate:  78 PR Interval:  198 QRS Duration: 178 QT Interval:  470 QTC Calculation: 535 R Axis:   -87 Text Interpretation:  Age not entered, assumed to be  79 years old for purpose of ECG interpretation Atrial-sensed ventricular-paced complexes No further analysis attempted due to paced rhythm No significant change since last tracing Abnormal ekg            MDM   1. Severe sepsis   2. CHF (congestive heart failure)   3. Seizures   4. UTI (urinary tract infection)    Present today with severe sepsis. He is a DO NOT RESUSCITATE and DNI. Disorders that used in emergency department and vancomycin and cefepime were ordered prophylactically prior to UA been found as source of infection. Patient became hypotensive and was given a fluid bolus. Initially patient was not given aggressive fluid rehydration due to his heart failure history. Patient did respond well to fluids. Patient was admitted to the step down unit. Patient's vitals were stable  at time of transfer to floor. Family member is at bedside. Dr. Jeraldine Loots evaluated this patient and agrees with plan.    Medications  vancomycin (VANCOCIN) IVPB 1000 mg/200 mL premix (not administered)  ceFEPIme (MAXIPIME) 1 g in dextrose 5 % 50 mL IVPB (not administered)  fentaNYL (SUBLIMAZE) injection 25 mcg (not administered)  heparin injection 5,000 Units (not administered)  sodium chloride 0.9 % injection 3 mL (not administered)  0.9 %  sodium chloride infusion (not administered)  acetaminophen (TYLENOL) tablet 1,000 mg (not administered)  albuterol (PROVENTIL HFA;VENTOLIN HFA) 108 (90 BASE) MCG/ACT inhaler 2 puff (not administered)  albuterol (PROVENTIL) (5 MG/ML) 0.5% nebulizer solution 2.5 mg (not administered)  simvastatin (ZOCOR) tablet 20 mg (not administered)  phenytoin (DILANTIN)  ER capsule 100 mg (not administered)  polyethylene glycol (MIRALAX / GLYCOLAX) packet 17 g (not administered)  guaiFENesin (ROBITUSSIN) 100 MG/5ML solution 300 mg (not administered)  escitalopram (LEXAPRO) tablet 10 mg (not administered)  folic acid (FOLVITE) tablet 1 mg (not administered)  aspirin EC tablet 81 mg (not administered)  docusate sodium (COLACE) capsule 100 mg (not administered)  senna-docusate (Senokot-S) tablet 2 tablet (not administered)  sodium chloride 0.9 % bolus 500 mL (not administered)  calcium-vitamin D (OSCAL WITH D) 500-200 MG-UNIT per tablet 1 tablet (not administered)  0.9 %  sodium chloride infusion ( Intravenous Stopped 03/01/13 2344)  acetaminophen (TYLENOL) suppository 650 mg (650 mg Rectal Given 03/01/13 2201)  vancomycin (VANCOCIN) IVPB 1000 mg/200 mL premix (0 mg Intravenous Stopped 03/02/13 0010)  fentaNYL (SUBLIMAZE) injection 50 mcg (50 mcg Intravenous Given 03/01/13 2308)  levETIRAcetam (KEPPRA) 1,000 mg in sodium chloride 0.9 % 100 mL IVPB (0 mg Intravenous Stopped 03/02/13 0010)  sodium chloride 0.9 % bolus 1,000 mL (1,000 mLs Intravenous Transfusing/Transfer 03/02/13 0011)      Mora Bellman, PA-C 03/02/13 [phone]

## 2013-03-02 ENCOUNTER — Inpatient Hospital Stay (HOSPITAL_COMMUNITY): Payer: Medicare Other

## 2013-03-02 DIAGNOSIS — R569 Unspecified convulsions: Secondary | ICD-10-CM

## 2013-03-02 DIAGNOSIS — N390 Urinary tract infection, site not specified: Secondary | ICD-10-CM | POA: Diagnosis present

## 2013-03-02 DIAGNOSIS — R6520 Severe sepsis without septic shock: Secondary | ICD-10-CM | POA: Diagnosis present

## 2013-03-02 DIAGNOSIS — A419 Sepsis, unspecified organism: Secondary | ICD-10-CM

## 2013-03-02 DIAGNOSIS — I509 Heart failure, unspecified: Secondary | ICD-10-CM

## 2013-03-02 LAB — LACTIC ACID, PLASMA: Lactic Acid, Venous: 2 mmol/L (ref 0.5–2.2)

## 2013-03-02 LAB — URINALYSIS, ROUTINE W REFLEX MICROSCOPIC
Bilirubin Urine: NEGATIVE
Glucose, UA: NEGATIVE mg/dL
Ketones, ur: NEGATIVE mg/dL
Nitrite: NEGATIVE
Specific Gravity, Urine: 1.017 (ref 1.005–1.030)
pH: 5.5 (ref 5.0–8.0)

## 2013-03-02 LAB — BASIC METABOLIC PANEL
CO2: 24 mEq/L (ref 19–32)
Calcium: 9 mg/dL (ref 8.4–10.5)
Creatinine, Ser: 1.64 mg/dL — ABNORMAL HIGH (ref 0.50–1.35)
GFR calc Af Amer: 42 mL/min — ABNORMAL LOW (ref >90)
GFR calc non Af Amer: 37 mL/min — ABNORMAL LOW (ref >90)
Glucose, Bld: 135 mg/dL — ABNORMAL HIGH (ref 70–99)
Potassium: 4 mEq/L (ref 3.5–5.1)
Sodium: 135 mEq/L (ref 135–145)

## 2013-03-02 LAB — BLOOD GAS, ARTERIAL
Bicarbonate: 22.8 mEq/L (ref 20.0–24.0)
O2 Content: 2 L/min
O2 Saturation: 96.5 %
TCO2: 24 mmol/L (ref 0–100)
pCO2 arterial: 37 mmHg (ref 35.0–45.0)
pH, Arterial: 7.408 (ref 7.350–7.450)

## 2013-03-02 LAB — CBC
MCH: 34.2 pg — ABNORMAL HIGH (ref 26.0–34.0)
MCHC: 34.5 g/dL (ref 30.0–36.0)
RBC: 3.48 MIL/uL — ABNORMAL LOW (ref 4.22–5.81)
RDW: 13.2 % (ref 11.5–15.5)

## 2013-03-02 LAB — URINE MICROSCOPIC-ADD ON

## 2013-03-02 LAB — GLUCOSE, CAPILLARY: Glucose-Capillary: 166 mg/dL — ABNORMAL HIGH (ref 70–99)

## 2013-03-02 MED ORDER — CALCIUM CARBONATE-VITAMIN D 500-200 MG-UNIT PO TABS
1.0000 | ORAL_TABLET | Freq: Every day | ORAL | Status: DC
  Administered 2013-03-02 – 2013-03-06 (×5): 1 via ORAL
  Filled 2013-03-02 (×8): qty 1

## 2013-03-02 MED ORDER — ESCITALOPRAM OXALATE 10 MG PO TABS
10.0000 mg | ORAL_TABLET | Freq: Every day | ORAL | Status: DC
  Administered 2013-03-02 – 2013-03-06 (×5): 10 mg via ORAL
  Filled 2013-03-02 (×5): qty 1

## 2013-03-02 MED ORDER — ASPIRIN EC 81 MG PO TBEC
81.0000 mg | DELAYED_RELEASE_TABLET | Freq: Every day | ORAL | Status: DC
  Administered 2013-03-02 – 2013-03-06 (×5): 81 mg via ORAL
  Filled 2013-03-02 (×5): qty 1

## 2013-03-02 MED ORDER — DOCUSATE SODIUM 100 MG PO CAPS
100.0000 mg | ORAL_CAPSULE | Freq: Two times a day (BID) | ORAL | Status: DC | PRN
  Filled 2013-03-02: qty 1

## 2013-03-02 MED ORDER — FOLIC ACID 1 MG PO TABS
1.0000 mg | ORAL_TABLET | ORAL | Status: DC
  Administered 2013-03-02 – 2013-03-06 (×4): 1 mg via ORAL
  Filled 2013-03-02 (×5): qty 1

## 2013-03-02 MED ORDER — SENNOSIDES-DOCUSATE SODIUM 8.6-50 MG PO TABS
2.0000 | ORAL_TABLET | Freq: Every evening | ORAL | Status: DC | PRN
  Filled 2013-03-02: qty 2

## 2013-03-02 MED ORDER — POLYETHYLENE GLYCOL 3350 17 G PO PACK
17.0000 g | PACK | Freq: Every day | ORAL | Status: DC | PRN
  Filled 2013-03-02: qty 1

## 2013-03-02 MED ORDER — GUAIFENESIN 100 MG/5ML PO SOLN
300.0000 mg | ORAL | Status: DC | PRN
  Filled 2013-03-02: qty 15

## 2013-03-02 MED ORDER — SODIUM CHLORIDE 0.9 % IJ SOLN
3.0000 mL | Freq: Two times a day (BID) | INTRAMUSCULAR | Status: DC
  Administered 2013-03-02 – 2013-03-05 (×5): 3 mL via INTRAVENOUS

## 2013-03-02 MED ORDER — DEXTROSE 5 % IV SOLN
1.0000 g | INTRAVENOUS | Status: DC
  Administered 2013-03-02 – 2013-03-04 (×3): 1 g via INTRAVENOUS
  Filled 2013-03-02 (×3): qty 1

## 2013-03-02 MED ORDER — ALBUTEROL SULFATE HFA 108 (90 BASE) MCG/ACT IN AERS
2.0000 | INHALATION_SPRAY | Freq: Two times a day (BID) | RESPIRATORY_TRACT | Status: DC
  Filled 2013-03-02: qty 6.7

## 2013-03-02 MED ORDER — OSELTAMIVIR PHOSPHATE 75 MG PO CAPS: 75.0000 mg | ORAL_CAPSULE | Freq: Two times a day (BID) | ORAL | Status: DC

## 2013-03-02 MED ORDER — ALBUTEROL SULFATE (5 MG/ML) 0.5% IN NEBU: 2.5000 mg | INHALATION_SOLUTION | RESPIRATORY_TRACT | Status: DC | PRN

## 2013-03-02 MED ORDER — SODIUM CHLORIDE 0.9 % IV SOLN
INTRAVENOUS | Status: DC
  Administered 2013-03-02 – 2013-03-04 (×3): via INTRAVENOUS

## 2013-03-02 MED ORDER — PHENYTOIN SODIUM EXTENDED 100 MG PO CAPS
100.0000 mg | ORAL_CAPSULE | Freq: Two times a day (BID) | ORAL | Status: DC
  Administered 2013-03-02 – 2013-03-06 (×9): 100 mg via ORAL
  Filled 2013-03-02 (×12): qty 1

## 2013-03-02 MED ORDER — SODIUM CHLORIDE 0.9 % IV BOLUS (SEPSIS)
500.0000 mL | INTRAVENOUS | Status: DC | PRN
  Administered 2013-03-02 (×2): 500 mL via INTRAVENOUS

## 2013-03-02 MED ORDER — HEPARIN SODIUM (PORCINE) 5000 UNIT/ML IJ SOLN
5000.0000 [IU] | Freq: Three times a day (TID) | INTRAMUSCULAR | Status: DC
  Administered 2013-03-02 – 2013-03-06 (×12): 5000 [IU] via SUBCUTANEOUS
  Filled 2013-03-02 (×19): qty 1

## 2013-03-02 MED ORDER — CALCIUM-VITAMIN D 600-200 MG-UNIT PO TABS: 1.0000 | ORAL_TABLET | Freq: Two times a day (BID) | ORAL | Status: DC

## 2013-03-02 MED ORDER — ACETAMINOPHEN 500 MG PO TABS
1000.0000 mg | ORAL_TABLET | Freq: Four times a day (QID) | ORAL | Status: DC | PRN
  Filled 2013-03-02: qty 2

## 2013-03-02 MED ORDER — SIMVASTATIN 20 MG PO TABS
20.0000 mg | ORAL_TABLET | Freq: Every day | ORAL | Status: DC
  Administered 2013-03-02 – 2013-03-04 (×3): 20 mg via ORAL
  Filled 2013-03-02 (×5): qty 1

## 2013-03-02 MED ORDER — FENTANYL CITRATE 0.05 MG/ML IJ SOLN: 25.0000 ug | INTRAMUSCULAR | Status: DC | PRN

## 2013-03-02 NOTE — Progress Notes (Signed)
INITIAL NUTRITION ASSESSMENT  DOCUMENTATION CODES Per approved criteria  -Not Applicable   INTERVENTION: Once diet is advanced, recommend Dysphagia 3 diet. Recommend Ensure Complete po BID, each supplement provides 350 kcal and 13 grams of protein, once diet order allows. RD to continue to follow nutrition care plan.  NUTRITION DIAGNOSIS: Inadequate oral intake related to inability to eat as evidenced by NPO status.   Goal: Diet advancement as tolerated; Intake to meet >90% of estimated nutrition needs.  Monitor:  weight trends, lab trends, I/O's, diet advancement  Reason for Assessment: Malnutrition Screening Tool  79 y.o. male  Admitting Dx: UTI (urinary tract infection)  ASSESSMENT: PMHx significant for CVA, prostate CA, depression, dementia. From SNF. Admitted with fever and elevated WBC. Work-up reveals UTI with sepsis. Pt had seizures in the ED.  Pt with slight, non-significant, wt loss of 6% x 4 months. Discussed nutrition hx with family member at bedside. Pt was eating relatively well at the nursing home prior to onset of his symptoms. Pt eats a Dysphagia 3 diet with no supplements at baseline at the nursing home. Niece reports that pt loves supplements like Ensure or Boost so if pt needs them during this hospitalization, he would be amenable. Pt does not like pizza or spaghetti, but otherwise is not a picky eater. Pt is currently sleeping, but appears to be well-nourished per brief, physical exam.  Pt currently afebrile.  Height: Ht Readings from Last 1 Encounters:  01/16/13 5\' 7"  (1.702 m)    Weight: 154 lb (69.8 kg) per bedscale  Ideal Body Weight: 148 lb  % Ideal Body Weight: 99%  Wt Readings from Last 10 Encounters:  01/22/13 150 lb 12.7 oz (68.4 kg)  12/04/12 149 lb 14.6 oz (68 kg)  12/04/12 149 lb 14.6 oz (68 kg)  10/04/12 154 lb (69.854 kg)  10/04/12 162 lb (73.483 kg)    Usual Body Weight: 155 - 165 lb  % Usual Body Weight: 94%  Body mass  index is 24.1 kg/(m^2). WNL  Estimated Nutritional Needs: Kcal: 1800 - 2000 Protein: 65 - 75 g Fluid: 1.8 - 2 liters  Skin: intact  Diet Order: NPO  EDUCATION NEEDS: -No education needs identified at this time   Intake/Output Summary (Last 24 hours) at 03/02/13 0910 Last data filed at 03/02/13 0800  Gross per 24 hour  Intake 1243.75 ml  Output    350 ml  Net 893.75 ml    Last BM: PTA  Labs:   Recent Labs Lab 03/01/13 2116 03/02/13 0426  NA 131* 135  K 4.0 4.0  CL 94* 99  CO2 24 24  BUN 36* 34*  CREATININE 1.74* 1.64*  CALCIUM 9.6 9.0  GLUCOSE 167* 135*    CBG (last 3)   Recent Labs  03/02/13 0052  GLUCAP 166*    Scheduled Meds: . albuterol  2 puff Inhalation BID  . aspirin EC  81 mg Oral Daily  . calcium-vitamin D  1 tablet Oral Q breakfast  . ceFEPime (MAXIPIME) IV  1 g Intravenous Q24H  . escitalopram  10 mg Oral Daily  . folic acid  1 mg Oral BH-q7a  . heparin  5,000 Units Subcutaneous Q8H  . phenytoin  100 mg Oral BID  . simvastatin  20 mg Oral q1800  . sodium chloride  3 mL Intravenous Q12H  . vancomycin  1,000 mg Intravenous Q24H    Continuous Infusions: . sodium chloride 125 mL/hr at 03/02/13 1610    Past Medical History  Diagnosis Date  . Stroke 2005  . Hypertension   . Hyperlipidemia   . Prostate cancer     radiation  . 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 Seizures 10/04/2012    Remote , last seizure in 1994. On Dilantin  100 mg bd po.  . Depression   . Dementia   . Acute CHF 01/16/2013    Dr tat , dr Ladona Ridgel.   . Cardiac pacemaker in situ 02/11/2013    Past Surgical History  Procedure Laterality Date  . Prostate biopsy  2003  . Appendectomy  1939  . Pacemaker insertion  12/03/2012    St. Jude Assurity dual-chamber pacemaker, serial number [id number]    Jarold Motto MS, RD, LDN Pager: [phone] After-hours pager: [phone]

## 2013-03-02 NOTE — H&P (Addendum)
[facility] History and Physical  DAMIAN BUCKLES ZOX:[id number] DOB: 09/21/1924 DOA: 03/01/2013  Referring physician: ED PCP: Rayford Halsted, PT  Chief Complaint: Sepsis  HPI: [name] Holmes is a 79 y.o. male who presents to the ED with Fever and elevated WBC.  He was initially mentating at his baseline; however, declined abruptly in the ED after receiving fentanyl for pain control.  His BP dropped to 70/48, thankfully he recovered with 750 cc NS bolus initiated in the ED.  He is now complaining of pain all over and does not specify a specific spot.  He is running a fever of 102.1, has a WBC of 23k, ultimately his source of infection appears to be UTI as demonstrated on UA.  Urine and blood cultures pending.  He reportedly had seizures during the EDPs exam so Keppra 1gm IV was ordered and administered to the patient.  He received vancomycin, aztreonam, in ED, he also received rocephin today at the nursing home.  Levaquin was initially ordered but I have changed this to cefepime (since he tolerated rocephin and levaquin is associated with increased risk of seizure).  Review of Systems: 12 systems reviewed and otherwise negative.  Past Medical History  Diagnosis Date  . Stroke 2005  . Hypertension   . Hyperlipidemia   . Prostate cancer     radiation  . 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 Seizures 10/04/2012    Remote , last seizure in 1994. On Dilantin  100 mg bd po.  . Depression   . Dementia   . Acute CHF 01/16/2013    Dr tat , dr Ladona Ridgel.   . Cardiac pacemaker in situ 02/11/2013   Past Surgical History  Procedure Laterality Date  . Prostate biopsy  2003  . Appendectomy  1939  . Pacemaker insertion  12/03/2012    St. Jude Assurity dual-chamber pacemaker, serial number [id number]   Social History:  reports that he has never smoked. He has never used smokeless tobacco. He reports that he does not drink alcohol or use illicit drugs.   Allergies  Allergen Reactions  .  Risedronate Sodium Other (See Comments)    Severe headache  . Penicillins Other (See Comments)  . Pollen Extract Other (See Comments)    Family History  Problem Relation Age of Onset  . Bipolar disorder Sister 55    psychotic episodes, 8119,14,78 , 98.  . Stroke Mother 42    CVA  . Stroke Father 84    CVA , alcohol    Prior to Admission medications   Medication Sig Start Date End Date Taking? Authorizing Provider  acetaminophen (TYLENOL) 500 MG tablet Take 1,000 mg by mouth every 6 (six) hours as needed for mild pain.   Yes Historical Provider, MD  albuterol (PROVENTIL HFA;VENTOLIN HFA) 108 (90 BASE) MCG/ACT inhaler Inhale 2 puffs into the lungs 2 (two) times daily.   Yes Historical Provider, MD  albuterol (PROVENTIL) (2.5 MG/3ML) 0.083% nebulizer solution Take 2.5 mg by nebulization every 6 (six) hours as needed for shortness of breath.   Yes Historical Provider, MD  aspirin EC 81 MG tablet Take 81 mg by mouth daily.   Yes Historical Provider, MD  Calcium-Vitamin D 600-200 MG-UNIT per tablet Take 1 tablet by mouth 2 (two) times daily.     Yes Historical Provider, MD  cefTRIAXone (ROCEPHIN) 1 G injection Inject 1 g into the muscle See admin instructions. *inject 1gm daily for 3 days*   Yes Historical Provider, MD  docusate sodium (COLACE) 100 MG capsule Take 100 mg by mouth 2 (two) times daily as needed for mild constipation.   Yes Historical Provider, MD  escitalopram (LEXAPRO) 10 MG tablet Take 10 mg by mouth daily.   Yes Historical Provider, MD  folic acid (FOLVITE) 1 MG tablet Take 1 mg by mouth every morning.     Yes Historical Provider, MD  furosemide (LASIX) 40 MG tablet Take 40 mg by mouth daily.   Yes Historical Provider, MD  guaiFENesin (ROBITUSSIN) 100 MG/5ML SOLN Take 300 mg by mouth every 4 (four) hours as needed for cough or to loosen phlegm.   Yes Historical Provider, MD  lisinopril (PRINIVIL,ZESTRIL) 2.5 MG tablet Take 2.5 mg by mouth daily.   Yes Historical Provider, MD   metoprolol (LOPRESSOR) 50 MG tablet Take 50 mg by mouth 2 (two) times daily.   Yes Historical Provider, MD  phenytoin (DILANTIN) 100 MG ER capsule Take 100 mg by mouth 2 (two) times daily.   Yes Historical Provider, MD  polyethylene glycol (MIRALAX / GLYCOLAX) packet Take 17 g by mouth daily as needed for mild constipation.   Yes Historical Provider, MD  pravastatin (PRAVACHOL) 40 MG tablet Take 40 mg by mouth at bedtime.   Yes Historical Provider, MD  sennosides-docusate sodium (SENOKOT-S) 8.6-50 MG tablet Take 2 tablets by mouth at bedtime as needed for constipation.   Yes Historical Provider, MD  spironolactone (ALDACTONE) 25 MG tablet Take 25 mg by mouth daily.   Yes Historical Provider, MD  triamterene-hydrochlorothiazide (MAXZIDE-25) 37.5-25 MG per tablet Take 1 tablet by mouth daily.   Yes Historical Provider, MD   Physical Exam: Filed Vitals:   03/02/13 0000  BP: 107/76  Pulse: 74  Temp:   Resp: 16    General:  Moderate distress, complains of pain "all over" Eyes: PEERLA EOMI ENT: mucous membranes moist Neck: supple w/o JVD Cardiovascular: RRR w/o MRG Respiratory: CTA B Abdomen: soft, nt, nd, bs+, does have a cyst appearing structure below his umbilicus Skin: no rash nor lesion Musculoskeletal: MAE, full ROM all 4 extremities Psychiatric: delirious tone and affect Neurologic: awake, alert, grossly non-focal  Labs on Admission:  Basic Metabolic Panel:  Recent Labs Lab 03/01/13 2116  NA 131*  K 4.0  CL 94*  CO2 24  GLUCOSE 167*  BUN 36*  CREATININE 1.74*  CALCIUM 9.6   Liver Function Tests:  Recent Labs Lab 03/01/13 2116  AST 15  ALT 11  ALKPHOS 118*  BILITOT 0.3  PROT 6.7  ALBUMIN 2.6*   No results found for this basename: LIPASE, AMYLASE,  in the last 168 hours No results found for this basename: AMMONIA,  in the last 168 hours CBC:  Recent Labs Lab 03/01/13 2116  WBC 23.9*  NEUTROABS 20.4*  HGB 12.3*  HCT 35.0*  MCV 97.5  PLT 284    Cardiac Enzymes: No results found for this basename: CKTOTAL, CKMB, CKMBINDEX, TROPONINI,  in the last 168 hours  BNP (last 3 results)  Recent Labs  01/16/13 1423 01/22/13 0403  PROBNP 7222.0* 2875.0*   CBG: No results found for this basename: GLUCAP,  in the last 168 hours  Radiological Exams on Admission: Dg Chest Port 1 View  03/01/2013   CLINICAL DATA:  Fever.  EXAM: PORTABLE CHEST - 1 VIEW  COMPARISON:  01/19/2013.  FINDINGS: Left subclavian pacemaker lead tips project over the right atrium and right ventricle. Trachea is midline. Heart size stable. There is a coarsened appearance of the lung  bases, as on multiple prior exams. No airspace consolidation or pleural fluid. Old right clavicle fracture.  IMPRESSION: 1. Bibasilar pulmonary parenchymal coarsening appears chronic and may be due to fibrosis. 2. No acute findings.   Electronically Signed   By: Leanna Battles M.D.   On: 03/01/2013 21:32    EKG: Independently reviewed.  Assessment/Plan Principal Problem:   UTI (urinary tract infection) Active Problems:   Seizures   CHF (congestive heart failure)   Severe sepsis   1. UTI causing severe sepsis - UA demonstrates UTI, patient with fever of 102.1 WBC of 23k.  Will continue patient on cefepime and vancomycin for now while cultures are pending.  1 dose of rocephin earlier today and 1 dose of aztreonam in ED also given.  Avoiding Levaquin if possible due to seizure risk.  Hypotension responded to fluids, have ordered PRN 500cc boluses for MAP < 65.  Holding all home BP meds and diuretics, patient is at risk for requiring diuresis acutely and developing decompensated CHF later on during the hospital stay as his sepsis resolves.  Ordering renal ultrasound to evaluate for any evidence of pyelonephritis or abscess.  Strict intake and output ordered and foley in place.  Tylenol for fever. 2. Seizures - continue home phenytoin, keppra 1 gm given in ED, if patient develops further  seizure activity then will continue keppra, otherwise will just continue the phenytoin for now and attempt to control his fever with tylenol.    Code Status: DNR (must indicate code status--if unknown or must be presumed, indicate so) Family Communication: Spoke with family including HPOA at bedside, code status of DNR/DNI has been reaffirmed by family, they are aware of how serious the patients condition of sepsis with hypotension can be (indicate person spoken with, if applicable, with phone number if by telephone) Disposition Plan: Admit to SDU (indicate anticipated LOS)  Time spent: 130 min, additional time needed to address patients acute decompensation.  GARDNER, JARED M. [facility] Pager [phone]  If 7PM-7AM, please contact night-coverage [email or web address] Password [email or web address] 03/02/2013, 12:58 AM

## 2013-03-02 NOTE — ED Provider Notes (Signed)
Medical screening examination/treatment/procedure(s) were conducted as a shared visit with non-physician practitioner(s) and myself.  I personally evaluated the patient during the encounter.  EKG Interpretation     Ventricular Rate:  78 PR Interval:  198 QRS Duration: 178 QT Interval:  470 QTC Calculation: 535 R Axis:   -87 Text Interpretation:  Age not entered, assumed to be  79 years old for purpose of ECG interpretation Atrial-sensed ventricular-paced complexes No further analysis attempted due to paced rhythm No significant change since last tracing Abnormal ekg            On my initial exam it was clear that the patient was quite ill, with early suspicion of sepsis.  Empiric IVF, ABX were initiated.  He had decreased BP soon after arrival, but this improved with IVF resuscitation.  He was awake, and oriented, but listless.  After the initial interventions were started, the patient's POA (niece) arrived.  She clarified that the patient was DNR/ DNI, but all other medical interventions, including pressors were appropriate.  The patient seemed to agree with this as well. The patient's labs were notable for leukocytosis, lactic acidosis.  Patient was febrile as well.  UA c/w UTI. We had multiple conversations with the inpatient care team, and the patient was admitted to a step-down unit given his critical illness.  CRITICAL CARE Performed by: Gerhard Munch Total critical care time: 45 Critical care time was exclusive of separately billable procedures and treating other patients. Critical care was necessary to treat or prevent imminent or life-threatening deterioration. Critical care was time spent personally by me on the following activities: development of treatment plan with patient and/or surrogate as well as nursing, discussions with consultants, evaluation of patient's response to treatment, examination of patient, obtaining history from patient or surrogate, ordering and  performing treatments and interventions, ordering and review of laboratory studies, ordering and review of radiographic studies, pulse oximetry and re-evaluation of patient's condition.   Gerhard Munch, MD 03/02/13 [phone]

## 2013-03-02 NOTE — Progress Notes (Signed)
Patient seen and examined at bedside.  Clinically patient appears to be doing significantly better than when initially admitted last night.  MAPs at goal, UOP 800 over the day, rechecking BMP now and again in AM to trend creatinine.  Patient is now oriented to self, thought he was at [facility].  States he is not in pain anywhere.  No evidence yet of fluid overload, will back down on IVF to 50-75 cc/hr to prevent overload.  Continuing to hold BP meds and lasix at this time.  Patient remains on vanc and cefepime for UTI with sepsis, culture is still growing in lab, plan to de-escalate when sensitivities are back.  Family had initially requested palliative care consultation last night and this may be obtained tomorrow or Monday.

## 2013-03-02 NOTE — Progress Notes (Signed)
Dr. Julian Reil made aware of positive MRSA nasal PCR, contact isolation ordered. MD made aware of change in mentation, ABG ordered. MD made aware of BP trending down, 500 cc initial bolus, will monitor MAP as well as BP.  Corliss Skains RN

## 2013-03-02 NOTE — ED Notes (Signed)
Report attempted x2, receiving RN states to give report at bedside

## 2013-03-02 NOTE — Progress Notes (Signed)
Utilization review completed.  P.J. Melia Hopes,RN,BSN Case Manager [phone]  

## 2013-03-03 LAB — CBC
HCT: 31.2 % — ABNORMAL LOW (ref 39.0–52.0)
MCHC: 34.3 g/dL (ref 30.0–36.0)
RDW: 13.3 % (ref 11.5–15.5)
WBC: 17.6 10*3/uL — ABNORMAL HIGH (ref 4.0–10.5)

## 2013-03-03 LAB — URINE CULTURE: Culture: NO GROWTH

## 2013-03-03 LAB — BASIC METABOLIC PANEL
BUN: 27 mg/dL — ABNORMAL HIGH (ref 6–23)
CO2: 25 mEq/L (ref 19–32)
Chloride: 105 mEq/L (ref 96–112)
GFR calc Af Amer: 67 mL/min — ABNORMAL LOW (ref >90)
GFR calc non Af Amer: 58 mL/min — ABNORMAL LOW (ref >90)
Potassium: 3.6 mEq/L (ref 3.5–5.1)
Sodium: 137 mEq/L (ref 135–145)

## 2013-03-03 LAB — GLUCOSE, CAPILLARY: Glucose-Capillary: 100 mg/dL — ABNORMAL HIGH (ref 70–99)

## 2013-03-03 MED ORDER — FENTANYL CITRATE 0.05 MG/ML IJ SOLN: 25.0000 ug | INTRAMUSCULAR | Status: DC | PRN

## 2013-03-03 MED ORDER — ACETAMINOPHEN 325 MG PO TABS
650.0000 mg | ORAL_TABLET | Freq: Four times a day (QID) | ORAL | Status: DC | PRN
  Administered 2013-03-03 – 2013-03-05 (×5): 650 mg via ORAL
  Filled 2013-03-03 (×6): qty 2

## 2013-03-03 NOTE — Progress Notes (Addendum)
[facility] Progress Note [place] TEAM 1 - Stepdown/ICU TEAM   [name] Holmes ZOX:[id number] DOB: Feb 03, 1925 DOA: 03/01/2013 PCP: Rayford Halsted, PT  Admit HPI / Brief Narrative: 79 y.o. male who presented to the ED with fever and elevated WBC. He was initially mentating at his baseline; however, declined abruptly in the ED after receiving fentanyl for pain control. His BP dropped to 70/48, thankfully he recovered with 750 cc NS bolus. He was complaining of pain all over. He was febrile at 102.1 and had a WBC of 23k.  His UA was c/w UTI/Pyelo.  He reportedly had a seizure during the EDP exam so Keppra was administered.   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  EF 25-30% via TTE Oct 2014 w/ grade I DD - care w/ volume to avoid overload - appears mildly DH at present   HTN Not an active issue at this time  HLD  Seizure D/O Cont home meds only for now - check dilantin level in AM - may simply have been exacerbated by acute illness - follow   Dementia Not currently agitated or combative - is alert to place   MRSA screen + Usual contact precautions   Code Status: NO CODE BLUE Family Communication: no family present at time of exam Disposition Plan: transfer to medical bed   Consultants: none  Procedures: none   Antibiotics: azactam 11/14 levaquin 11/14 maxipime 11/14 >> vanc 11/14>>  DVT prophylaxis: SQ heparin   HPI/Subjective: The pt is resting comfortably.  He is alert, but mildly confused/demented.  He currently denies any complaints, to include no cp, sob, f/c, n/v, or abdom pain.  Objective: Blood pressure  110/59, pulse 67, temperature 99.2 F (37.3 C), temperature source Core (Comment), resp. rate 16, height 5' 6.93" (1.7 m), weight 69.8 kg (153 lb 14.1 oz), SpO2 95.00%.  Intake/Output Summary (Last 24 hours) at 03/03/13 0854 Last data filed at 03/03/13 0800  Gross per 24 hour  Intake   2528 ml  Output    600 ml  Net   1928 ml   Exam: General: No acute respiratory distress Lungs: Clear to auscultation bilaterally without wheezes or crackles Cardiovascular: Regular rate and rhythm without murmur gallop or rub normal S1 and S2 Abdomen: Nontender, nondistended, soft, bowel sounds positive, no rebound, no ascites, no appreciable mass Extremities: No significant cyanosis, clubbing, or edema bilateral lower extremities  Data Reviewed: Basic Metabolic Panel:  Recent Labs Lab 03/01/13 2116 03/02/13 0426 03/03/13 0348  NA 131* 135 137  K 4.0 4.0 3.6  CL 94* 99 105  CO2 24 24 25   GLUCOSE 167* 135* 95  BUN 36* 34* 27*  CREATININE 1.74* 1.64* 1.12  CALCIUM 9.6 9.0 8.7   Liver Function Tests:  Recent Labs Lab 03/01/13 2116  AST 15  ALT 11  ALKPHOS 118*  BILITOT 0.3  PROT 6.7  ALBUMIN 2.6*   CBC:  Recent Labs Lab 03/01/13 2116 03/02/13 0426 03/03/13 0348  WBC 23.9* 26.2* 17.6*  NEUTROABS 20.4*  --   --   HGB 12.3* 11.9* 10.7*  HCT 35.0* 34.5* 31.2*  MCV 97.5 99.1 99.4  PLT 284 267 265   BNP (last 3 results)  Recent Labs  01/16/13 1423 01/22/13 0403  PROBNP 7222.0* 2875.0*   CBG:  Recent Labs Lab 03/02/13 0052  GLUCAP 166*    Recent Results (from the past 240 hour(s))  MRSA PCR SCREENING     Status: Abnormal   Collection Time    03/02/13 12:54 AM      Result Value Range Status   MRSA by PCR POSITIVE (*) NEGATIVE Final   Comment:            The GeneXpert MRSA Assay (FDA     approved for NASAL specimens     only), is one component of a     comprehensive MRSA colonization     surveillance program. It is not     intended to diagnose MRSA      infection nor to guide or     monitor treatment for     MRSA infections.     RESULT CALLED TO, READ BACK BY AND VERIFIED WITH:     CALLED TO RN Corliss Skains [phone] @0515  THANEY     Studies:  Recent x-ray studies have been reviewed in detail by the Attending Physician  Scheduled Meds:  Scheduled Meds: . aspirin EC  81 mg Oral Daily  . calcium-vitamin D  1 tablet Oral Q breakfast  . ceFEPime (MAXIPIME) IV  1 g Intravenous Q24H  . escitalopram  10 mg Oral Daily  . folic acid  1 mg Oral BH-q7a  . heparin  5,000 Units Subcutaneous Q8H  . phenytoin  100 mg Oral BID  . simvastatin  20 mg Oral q1800  . sodium chloride  3 mL Intravenous Q12H  . vancomycin  1,000 mg Intravenous Q24H    Time spent on care of this patient: 35 mins   [facility] T  [facility] Office  [phone] Pager - Text Page per Loretha Stapler as per below:  On-Call/Text Page:      [email or web address]      password [email or web address]  If 7PM-7AM, please contact night-coverage [email or web address] Password [email or web address] 03/03/2013, 8:54 AM   LOS: 2 days

## 2013-03-04 DIAGNOSIS — F329 Major depressive disorder, single episode, unspecified: Secondary | ICD-10-CM

## 2013-03-04 LAB — COMPREHENSIVE METABOLIC PANEL
ALT: 7 U/L (ref 0–53)
AST: 17 U/L (ref 0–37)
Albumin: 2 g/dL — ABNORMAL LOW (ref 3.5–5.2)
Alkaline Phosphatase: 100 U/L (ref 39–117)
CO2: 19 mEq/L (ref 19–32)
Calcium: 8.8 mg/dL (ref 8.4–10.5)
Chloride: 105 mEq/L (ref 96–112)
Creatinine, Ser: 1.05 mg/dL (ref 0.50–1.35)
GFR calc non Af Amer: 63 mL/min — ABNORMAL LOW (ref >90)
Glucose, Bld: 86 mg/dL (ref 70–99)
Potassium: 3.4 mEq/L — ABNORMAL LOW (ref 3.5–5.1)
Sodium: 137 mEq/L (ref 135–145)
Total Bilirubin: 0.3 mg/dL (ref 0.3–1.2)

## 2013-03-04 LAB — CBC
MCH: 34.5 pg — ABNORMAL HIGH (ref 26.0–34.0)
MCHC: 36.2 g/dL — ABNORMAL HIGH (ref 30.0–36.0)
MCV: 95.3 fL (ref 78.0–100.0)
Platelets: 312 10*3/uL (ref 150–400)
RBC: 2.96 MIL/uL — ABNORMAL LOW (ref 4.22–5.81)
RDW: 13 % (ref 11.5–15.5)

## 2013-03-04 LAB — PHENYTOIN LEVEL, TOTAL: Phenytoin Lvl: 4 ug/mL — ABNORMAL LOW (ref 10.0–20.0)

## 2013-03-04 MED ORDER — DEXTROSE 5 % IV SOLN
1.0000 g | INTRAVENOUS | Status: DC
  Administered 2013-03-05 – 2013-03-06 (×2): 1 g via INTRAVENOUS
  Filled 2013-03-04 (×3): qty 1

## 2013-03-04 MED ORDER — CHLORHEXIDINE GLUCONATE CLOTH 2 % EX PADS
6.0000 | MEDICATED_PAD | Freq: Every day | CUTANEOUS | Status: DC
  Administered 2013-03-05 – 2013-03-06 (×2): 6 via TOPICAL

## 2013-03-04 MED ORDER — MUPIROCIN 2 % EX OINT
1.0000 "application " | TOPICAL_OINTMENT | Freq: Two times a day (BID) | CUTANEOUS | Status: DC
  Administered 2013-03-04 – 2013-03-05 (×3): 1 via NASAL
  Filled 2013-03-04: qty 22

## 2013-03-04 MED ORDER — POTASSIUM CHLORIDE CRYS ER 20 MEQ PO TBCR
40.0000 meq | EXTENDED_RELEASE_TABLET | Freq: Four times a day (QID) | ORAL | Status: AC
  Administered 2013-03-04 (×2): 40 meq via ORAL
  Filled 2013-03-04 (×2): qty 2

## 2013-03-04 NOTE — Evaluation (Signed)
Physical Therapy Evaluation Patient Details Name: [name] Holmes MRN: [id number] DOB: 11/03/1924 Today's Date: 03/04/2013 Time: 4098-1191 PT Time Calculation (min): 36 min  PT Assessment / Plan / Recommendation History of Present Illness  79 y/o male admitted from nursing home with hypotension with UTI and sepsis.  Clinical Impression  Patient presents with decreased independence with mobility due deficits listed below.  He will benefit from skilled PT in the acute setting to maximize mobility and decrease burden of care.  Recommend continued PT and SNF upon d/c.    PT Assessment  Patient needs continued PT services    Follow Up Recommendations  SNF    Does the patient have the potential to tolerate intense rehabilitation    No  Barriers to Discharge  None      Equipment Recommendations  None recommended by PT    Recommendations for Other Services   None  Frequency Min 2X/week    Precautions / Restrictions Precautions Precautions: Fall Precaution Comments: Contact isolation   Pertinent Vitals/Pain Moaning, though only reports generalized aches.      Mobility  Bed Mobility Bed Mobility: Supine to Sit Supine to Sit: HOB elevated;With rails;3: Mod assist Details for Bed Mobility Assistance: assist for lifting trunk Transfers Transfers: Sit to Stand;Stand to Sit;Stand Pivot Transfers Sit to Stand: 3: Mod assist;With upper extremity assist;From chair/3-in-1;From bed Stand to Sit: 3: Mod assist;To chair/3-in-1 Details for Transfer Assistance: assist for safety and cues for technique, tendency to sit prior to being close to chair and although wanted to walk to bathroom was too ataxic and weak to do so safely so pivoted to [facility], then to recliner Ambulation/Gait Ambulation/Gait Assistance: 2: Max assist;3: Mod assist Ambulation Distance (Feet): 3 Feet Assistive device: Rolling walker Ambulation/Gait Assistance Details: pushing walker out in front with posterior bias and  ataxic movement of LE's Gait Pattern: Step-to pattern;Trunk flexed;Shuffle;Ataxic        PT Diagnosis: Generalized weakness;Abnormality of gait  PT Problem List: Decreased strength;Decreased knowledge of use of DME;Decreased safety awareness;Decreased activity tolerance;Decreased balance;Decreased mobility;Decreased coordination PT Treatment Interventions: DME instruction;Balance training;Functional mobility training;Patient/family education;Therapeutic activities;Therapeutic exercise;Wheelchair mobility training     PT Goals(Current goals can be found in the care plan section) Acute Rehab PT Goals Patient Stated Goal: To talk with neices PT Goal Formulation: Patient unable to participate in goal setting Time For Goal Achievement: 03/18/13 Potential to Achieve Goals: Fair  Visit Information  Last PT Received On: 03/04/13 Assistance Needed: +2 History of Present Illness: 79 y/o male admitted from nursing home with hypotension with UTI and sepsis.       Prior Functioning  Home Living Family/patient expects to be discharged to:: Skilled nursing facility Communication Communication: No difficulties Dominant Hand: Right    Cognition  Cognition Arousal/Alertness: Awake/alert Behavior During Therapy: WFL for tasks assessed/performed Overall Cognitive Status: Impaired/Different from baseline Area of Impairment: Memory;Safety/judgement;Orientation Orientation Level: Situation;Time Memory: Decreased short-term memory    Extremity/Trunk Assessment Lower Extremity Assessment Lower Extremity Assessment: Generalized weakness Cervical / Trunk Assessment Cervical / Trunk Assessment: Kyphotic   Balance Balance Balance Assessed: Yes Static Sitting Balance Static Sitting - Balance Support: Feet supported Static Sitting - Level of Assistance: 4: Min assist;5: Stand by assistance Static Sitting - Comment/# of Minutes: cues for balance and to steady self.  pt focused on getting to  bathroom Static Standing Balance Static Standing - Balance Support: Bilateral upper extremity supported Static Standing - Level of Assistance: 3: Mod assist  End of Session PT - End  of Session Equipment Utilized During Treatment: Gait belt Activity Tolerance: Patient limited by fatigue Patient left: in chair;with call bell/phone within reach;with chair alarm set Nurse Communication: Mobility status;Patient requests pain meds  GP     Georgiana Spillane,CYNDI 03/04/2013, 10:45 AM Sheran Lawless, PT [phone] 03/04/2013

## 2013-03-04 NOTE — Evaluation (Signed)
Occupational Therapy Evaluation Patient Details Name: [name] Holmes MRN: [id number] DOB: [birth year] Today's Date: 03/04/2013 Time: 4098-1191 OT Time Calculation (min): 38 min  OT Assessment / Plan / Recommendation History of present illness 79 y/o male admitted from nursing home with hypotension with UTI and sepsis.   Clinical Impression   Pt pleasantly confused during session.  Not oriented to place or time but was able to state that he had a bladder infection.  Pt currently needed total assist +2 for LB selfcare and toileting this session.  Will need extensive 24 hour assist at discharge.  Plan for return to SNF so feel he needs continued long term rehab there.  Will defer OT treatment to SNF    OT Assessment  Patient needs continued OT Services;All further OT needs can be met in the next venue of care    Follow Up Recommendations  SNF;Supervision/Assistance - 24 hour       Equipment Recommendations  None recommended by OT          Precautions / Restrictions Precautions Precautions: Fall Precaution Comments: Contact isolation   Pertinent Vitals/Pain No report of pain or dizziness during session, sats checked at 98% on room air and HR 91    ADL  Eating/Feeding: Performed;Set up Where Assessed - Eating/Feeding: Chair Grooming: Simulated;Set up Where Assessed - Grooming: Supported sitting Upper Body Bathing: Simulated;Set up Where Assessed - Upper Body Bathing: Supported sitting Lower Body Bathing: Simulated;+2 Total assistance Lower Body Bathing: Patient Percentage: 30% Where Assessed - Lower Body Bathing: Supported sit to stand Upper Body Dressing: Simulated;Minimal assistance Where Assessed - Upper Body Dressing: Unsupported sitting Lower Body Dressing: Simulated;+2 Total assistance Lower Body Dressing: Patient Percentage: 30% Where Assessed - Lower Body Dressing: Supported sit to stand Toilet Transfer: Performed;+1 Total assistance Toilet Transfer Method: Scientist, product/process development: Materials engineer and Hygiene: Performed;+2 Total assistance Toileting - Architect and Hygiene: Patient Percentage: 30% Where Assessed - Engineer, mining and Hygiene: Sit to stand from 3-in-1 or toilet Tub/Shower Transfer Method: Not assessed Transfers/Ambulation Related to ADLs: Pt needs total assist for stand pivot transfers to the bedsdie commode. ADL Comments: Pt with decreased ability to maintain full standing.  Maintained knee flexion in standing as well as trunk and cervical flexion.  Pt attempting to turn to the chair from the bedsdie commode when therapist was asking him to stand so the tech could assist with toilet hygiene.  Pt then only turned 1/2 way and was attempting to sit down without being close the the bedside recliner.  Therapist had to get tech to move chair closer or pt would have sat in the floor.       OT Problem List: Decreased strength;Decreased range of motion;Impaired balance (sitting and/or standing);Decreased safety awareness;Decreased cognition;Decreased knowledge of use of DME or AE   Visit Information  Last OT Received On: 03/04/13 Assistance Needed: +2 History of Present Illness: 79 y/o male admitted from nursing home with hypotension with UTI and sepsis.       Prior Functioning     Home Living Family/patient expects to be discharged to:: Skilled nursing facility Additional Comments: Pt unable to state exactly how long he has been at the SNF.  Also not able to state his PLOF. Communication Communication: No difficulties Dominant Hand: Right         Vision/Perception Vision - History Baseline Vision: Wears glasses all the time (he reports glasses were lost but they could be at  [facility]) Patient Visual Report: No change from baseline Vision - Assessment Vision Assessment: Vision not tested Perception Perception: Within Functional Limits Praxis Praxis:  Intact   Cognition  Cognition Arousal/Alertness: Awake/alert Behavior During Therapy: WFL for tasks assessed/performed Overall Cognitive Status: Impaired/Different from baseline Area of Impairment: Memory;Safety/judgement;Orientation Orientation Level: Disoriented to;Place;Time General Comments: Pt stating his birthday is tomorrow and he is turnning 14.  Actually his birthday is the [birth year] and he will be 71.    Extremity/Trunk Assessment Upper Extremity Assessment Upper Extremity Assessment: RUE deficits/detail;LUE deficits/detail RUE Deficits / Details: shoulder flexion AROM 0-90 degrees, strength 3+/5 throughout RUE Coordination: decreased fine motor LUE Deficits / Details: Pt with shoudler flexion AROM 0-90 degrees, shoulder strength 3+/5.  Pt with finger flexor contractures in all digits except the index finger and thumb. LUE Coordination: decreased fine motor Lower Extremity Assessment Lower Extremity Assessment: Defer to PT evaluation     Mobility Transfers Transfers: Sit to Stand Sit to Stand: 1: +1 Total assist;With armrests;With upper extremity assist;From chair/3-in-1 Stand to Sit: 1: +1 Total assist;With upper extremity assist;To chair/3-in-1 Details for Transfer Assistance: Pt with decreased ability to stand up completely and demonstrated increased posterior lean.          Balance Balance Balance Assessed: Yes Static Standing Balance Static Standing - Balance Support: Right upper extremity supported;Left upper extremity supported Static Standing - Level of Assistance: 1: +1 Total assist   End of Session OT - End of Session Activity Tolerance: Patient limited by fatigue Patient left: in chair;with call bell/phone within reach;with nursing/sitter in room Nurse Communication: Mobility status     Trygg Mantz OTR/L 03/04/2013, 3:39 PM

## 2013-03-04 NOTE — Progress Notes (Signed)
ANTIBIOTIC CONSULT NOTE - FOLLOW UP  Pharmacy Consult for Cefepime Indication: UTI/pyelnephritis  Allergies  Allergen Reactions  . Risedronate Sodium Other (See Comments)    Severe headache  . Penicillins Other (See Comments)  . Pollen Extract Other (See Comments)    Patient Measurements: Height: 5' 6.93" (170 cm) Weight: 158 lb 4.6 oz (71.8 kg) IBW/kg (Calculated) : 65.94  Vital Signs: Temp: 97.8 F (36.6 C) (11/17 0544) Temp src: Axillary (11/17 0544) BP: 132/70 mmHg (11/17 0544) Pulse Rate: 96 (11/17 0544) Intake/Output from previous day: 11/16 0701 - 11/17 0700 In: 1433.8 [I.V.:1233.8; IV Piggyback:200] Out: 1130 [Urine:1130]  Labs:  Recent Labs  03/02/13 0426 03/03/13 0348 03/04/13 0626  WBC 26.2* 17.6* 11.1*  HGB 11.9* 10.7* 10.2*  PLT 267 265 312  CREATININE 1.64* 1.12 1.05   Estimated Creatinine Clearance: 47.9 ml/min (by C-G formula based on Cr of 1.05).  Microbiology: Recent Results (from the past 720 hour(s))  CULTURE, BLOOD (ROUTINE X 2)     Status: None   Collection Time    03/01/13  9:15 PM      Result Value Range Status   Specimen Description BLOOD FOREARM LEFT   Final   Special Requests BOTTLES DRAWN AEROBIC AND ANAEROBIC B 10CC R 8CC   Final   Culture  Setup Time     Final   Value: 03/02/2013 05:01     Performed at [facility]   Culture     Final   Value:        BLOOD CULTURE RECEIVED NO GROWTH TO DATE CULTURE WILL BE HELD FOR 5 DAYS BEFORE ISSUING A FINAL NEGATIVE REPORT     Performed at [facility]   Report Status PENDING   Incomplete  CULTURE, BLOOD (ROUTINE X 2)     Status: None   Collection Time    03/01/13 10:00 PM      Result Value Range Status   Specimen Description BLOOD FOREARM RIGHT   Final   Special Requests BOTTLES DRAWN AEROBIC AND ANAEROBIC 10CC   Final   Culture  Setup Time     Final   Value: 03/02/2013 05:00     Performed at [facility]   Culture     Final   Value:        BLOOD CULTURE  RECEIVED NO GROWTH TO DATE CULTURE WILL BE HELD FOR 5 DAYS BEFORE ISSUING A FINAL NEGATIVE REPORT     Performed at [facility]   Report Status PENDING   Incomplete  URINE CULTURE     Status: None   Collection Time    03/02/13 12:27 AM      Result Value Range Status   Specimen Description URINE, CATHETERIZED   Final   Special Requests NONE   Final   Culture  Setup Time     Final   Value: 03/02/2013 01:32     Performed at [facility]   Culture     Final   Value: NO GROWTH     Performed at [facility]   Report Status 03/03/2013 FINAL   Final  MRSA PCR SCREENING     Status: Abnormal   Collection Time    03/02/13 12:54 AM      Result Value Range Status   MRSA by PCR POSITIVE (*) NEGATIVE Final   Comment:            The GeneXpert MRSA Assay (FDA     approved for NASAL  specimens     only), is one component of a     comprehensive MRSA colonization     surveillance program. It is not     intended to diagnose MRSA     infection nor to guide or     monitor treatment for     MRSA infections.     RESULT CALLED TO, READ BACK BY AND VERIFIED WITH:     CALLED TO RN JUAN CLAUDIO [phone] @0515  THANEY   Assessment:  Vancomycin discontinued, to continue Cefepime.  Day # 3 Cefepime 1 gram IV q24hrs. Creatinine has improved.  Dose remains appropriate for renal function.  Goal of Therapy:   appropriate Cefepime dose for renal function and infection  Plan:   Continue Cefepime 1 gram IV q24hrs.  Will follow renal function and clinical progress.  Dennie Fetters, [place] Pager: [phone] 03/04/2013,3:06 PM

## 2013-03-04 NOTE — Progress Notes (Addendum)
[facility] Progress Note    [name] Holmes:[id number] DOB: 06-14-1924 DOA: 03/01/2013 PCP: Rayford Halsted, PT  HPI/Subjective: Denies any complaints, he is resting comfortably. Oriented to place and person not to time.    Admit HPI / Brief Narrative: 79 y.o. male who presented to the ED with fever and elevated WBC. He was initially mentating at his baseline; however, declined abruptly in the ED after receiving fentanyl for pain control. His BP dropped to 70/48, thankfully he recovered with 750 cc NS bolus. He was complaining of pain all over. He was febrile at 102.1 and had a WBC of 23k.  His UA was c/w UTI/Pyelo.  He reportedly had a seizure during the EDP exam so Keppra was administered.   Assessment/Plan:  Septic shock due to pyelonephritis  -(Temp 102.1 / WBC 23K), presented with blood pressure of 95/63 -Showed clinical improvement, blood pressure stable, better after volume Expansion.  UTI/acute pyelonephritis -Patient is on empiric treatment with cefepime and vancomycin, will discontinue vancomycin. -Urine culture showed no growth, closure was done likely after antibiotic was given.  Acute renal failure -Presented with creatinine of 1.74. 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 -Will discontinue IV fluids.  HTN Not an active issue at this time  HLD  Seizure D/O Cont home meds only for now - check dilantin level in AM - may simply have been exacerbated by acute illness - follow   Dementia Not currently agitated or combative - is alert to place   MRSA screen + Usual contact precautions   Code Status: NO CODE BLUE Family Communication: no family present at time of exam Disposition Plan: transfer to medical bed   Consultants: none  Procedures: none   Antibiotics: azactam 11/14 levaquin 11/14 maxipime 11/14 >> vanc  11/14>>  DVT prophylaxis: SQ heparin     Objective: Blood pressure 132/70, pulse 96, temperature 97.8 F (36.6 C), temperature source Axillary, resp. rate 18, height 5' 6.93" (1.7 m), weight 71.8 kg (158 lb 4.6 oz), SpO2 96.00%.  Intake/Output Summary (Last 24 hours) at 03/04/13 1005 Last data filed at 03/04/13 0641  Gross per 24 hour  Intake   1220 ml  Output   1100 ml  Net    120 ml   Exam: General: No acute respiratory distress Lungs: Clear to auscultation bilaterally without wheezes or crackles Cardiovascular: Regular rate and rhythm without murmur gallop or rub normal S1 and S2 Abdomen: Nontender, nondistended, soft, bowel sounds positive, no rebound, no ascites, no appreciable mass Extremities: No significant cyanosis, clubbing, or edema bilateral lower extremities  Data Reviewed: Basic Metabolic Panel:  Recent Labs Lab 03/01/13 2116 03/02/13 0426 03/03/13 0348 03/04/13 0626  NA 131* 135 137 137  K 4.0 4.0 3.6 3.4*  CL 94* 99 105 105  CO2 24 24 25 19   GLUCOSE 167* 135* 95 86  BUN 36* 34* 27* 24*  CREATININE 1.74* 1.64* 1.12 1.05  CALCIUM 9.6 9.0 8.7 8.8   Liver Function Tests:  Recent Labs Lab 03/01/13 2116 03/04/13 0626  AST 15 17  ALT 11 7  ALKPHOS 118* 100  BILITOT 0.3 0.3  PROT 6.7 5.9*  ALBUMIN 2.6* 2.0*   CBC:  Recent Labs Lab 03/01/13 2116 03/02/13 0426 03/03/13 0348 03/04/13 0626  WBC 23.9* 26.2* 17.6* 11.1*  NEUTROABS 20.4*  --   --   --   HGB  12.3* 11.9* 10.7* 10.2*  HCT 35.0* 34.5* 31.2* 28.2*  MCV 97.5 99.1 99.4 95.3  PLT 284 267 265 312   BNP (last 3 results)  Recent Labs  01/16/13 1423 01/22/13 0403  PROBNP 7222.0* 2875.0*   CBG:  Recent Labs Lab 03/02/13 0052 03/03/13 1111  GLUCAP 166* 100*    Recent Results (from the past 240 hour(s))  CULTURE, BLOOD (ROUTINE X 2)     Status: None   Collection Time    03/01/13  9:15 PM      Result Value Range Status   Specimen Description BLOOD FOREARM LEFT   Final    Special Requests BOTTLES DRAWN AEROBIC AND ANAEROBIC B 10CC R 8CC   Final   Culture  Setup Time     Final   Value: 03/02/2013 05:01     Performed at [facility]   Culture     Final   Value:        BLOOD CULTURE RECEIVED NO GROWTH TO DATE CULTURE WILL BE HELD FOR 5 DAYS BEFORE ISSUING A FINAL NEGATIVE REPORT     Performed at [facility]   Report Status PENDING   Incomplete  CULTURE, BLOOD (ROUTINE X 2)     Status: None   Collection Time    03/01/13 10:00 PM      Result Value Range Status   Specimen Description BLOOD FOREARM RIGHT   Final   Special Requests BOTTLES DRAWN AEROBIC AND ANAEROBIC 10CC   Final   Culture  Setup Time     Final   Value: 03/02/2013 05:00     Performed at [facility]   Culture     Final   Value:        BLOOD CULTURE RECEIVED NO GROWTH TO DATE CULTURE WILL BE HELD FOR 5 DAYS BEFORE ISSUING A FINAL NEGATIVE REPORT     Performed at [facility]   Report Status PENDING   Incomplete  URINE CULTURE     Status: None   Collection Time    03/02/13 12:27 AM      Result Value Range Status   Specimen Description URINE, CATHETERIZED   Final   Special Requests NONE   Final   Culture  Setup Time     Final   Value: 03/02/2013 01:32     Performed at [facility]   Culture     Final   Value: NO GROWTH     Performed at [facility]   Report Status 03/03/2013 FINAL   Final  MRSA PCR SCREENING     Status: Abnormal   Collection Time    03/02/13 12:54 AM      Result Value Range Status   MRSA by PCR POSITIVE (*) NEGATIVE Final   Comment:            The GeneXpert MRSA Assay (FDA     approved for NASAL specimens     only), is one component of a     comprehensive MRSA colonization     surveillance program. It is not     intended to diagnose MRSA     infection nor to guide or     monitor treatment for     MRSA infections.     RESULT CALLED TO, READ BACK BY AND VERIFIED WITH:     CALLED TO RN Corliss Skains [phone] @0515   THANEY     Studies:  Recent x-ray studies have been reviewed in detail  by the Attending Physician  Scheduled Meds:  Scheduled Meds: . aspirin EC  81 mg Oral Daily  . calcium-vitamin D  1 tablet Oral Q breakfast  . ceFEPime (MAXIPIME) IV  1 g Intravenous Q24H  . escitalopram  10 mg Oral Daily  . folic acid  1 mg Oral BH-q7a  . heparin  5,000 Units Subcutaneous Q8H  . phenytoin  100 mg Oral BID  . simvastatin  20 mg Oral q1800  . sodium chloride  3 mL Intravenous Q12H  . vancomycin  1,000 mg Intravenous Q24H    Time spent on care of this patient: 35 mins   [facility] A  [facility] Office  [phone] Pager - Text Page per Loretha Stapler as per below:  On-Call/Text Page:      [email or web address]      password [email or web address]  If 7PM-7AM, please contact night-coverage [email or web address] Password [email or web address] 03/04/2013, 10:05 AM   LOS: 3 days

## 2013-03-05 LAB — BASIC METABOLIC PANEL
BUN: 19 mg/dL (ref 6–23)
CO2: 19 mEq/L (ref 19–32)
Calcium: 9.1 mg/dL (ref 8.4–10.5)
Chloride: 108 mEq/L (ref 96–112)
Creatinine, Ser: 1.07 mg/dL (ref 0.50–1.35)
GFR calc Af Amer: 71 mL/min — ABNORMAL LOW (ref >90)
GFR calc non Af Amer: 61 mL/min — ABNORMAL LOW (ref >90)
Glucose, Bld: 132 mg/dL — ABNORMAL HIGH (ref 70–99)
Potassium: 4.4 mEq/L (ref 3.5–5.1)
Sodium: 139 mEq/L (ref 135–145)

## 2013-03-05 MED ORDER — CEFUROXIME AXETIL 500 MG PO TABS: 500.0000 mg | ORAL_TABLET | Freq: Two times a day (BID) | ORAL | Status: AC

## 2013-03-05 MED ORDER — METOPROLOL TARTRATE 25 MG PO TABS
25.0000 mg | ORAL_TABLET | Freq: Two times a day (BID) | ORAL | Status: DC
  Administered 2013-03-05 – 2013-03-06 (×2): 25 mg via ORAL
  Filled 2013-03-05 (×4): qty 1

## 2013-03-05 MED ORDER — FUROSEMIDE 40 MG PO TABS
40.0000 mg | ORAL_TABLET | Freq: Once | ORAL | Status: DC
  Filled 2013-03-05 (×2): qty 1

## 2013-03-05 MED ORDER — METOPROLOL TARTRATE 50 MG PO TABS: 25.0000 mg | ORAL_TABLET | Freq: Two times a day (BID) | ORAL | Status: DC

## 2013-03-05 NOTE — Clinical Social Work Note (Signed)
CSW has notified [employer] of patient's DC for 03/05/13. CSW awaits call from [facility] for SNF authorization.    Roddie Mc, [place], [place], [phone]

## 2013-03-05 NOTE — Clinical Social Work Note (Signed)
CSW contacted [facility] Medicare representative [facility] Eliberto Ivory) to determine if SNF authorization has been received. Representative has not answered. Messages have been left for representative. [employer] Authorization process was started on 03/04/13, and CSW requested that the case be prioritized for patient's DC on 03/05/13. CSW will continue to follow.   Roddie Mc, [place], [place], [phone]

## 2013-03-05 NOTE — Clinical Social Work Note (Signed)
CSW notified HPOA Diane that patient is going to DC to [facility] SNF today. Diane is asking that the RN contact Nettie Elm [phone] and Diane [phone] so that Nettie Elm can be present at when DC instructions are given by RN. RN notified.  Roddie Mc, [place], [place], [phone]

## 2013-03-05 NOTE — Care Management Note (Addendum)
    Page 1 of 1   03/06/2013     4:18:09 PM   CARE MANAGEMENT NOTE 03/06/2013  Patient:  [name] Holmes, [name] Holmes   Account Number:  [id number]  Date Initiated:  03/05/2013  Documentation initiated by:  Letha Cape  Subjective/Objective Assessment:   dx uti, ams  admit- from [facility]- snf     Action/Plan:   pt eval- rec snf   Anticipated DC Date:  03/06/2013   Anticipated DC Plan:  SKILLED NURSING FACILITY  In-house referral  Clinical Social Worker      DC Planning Services  CM consult      Choice offered to / List presented to:             Status of service:  Completed, signed off Medicare Important Message given?   (If response is "NO", the following Medicare IM given date fields will be blank) Date Medicare IM given:   Date Additional Medicare IM given:    Discharge Disposition:  SKILLED NURSING FACILITY  Per UR Regulation:  Reviewed for med. necessity/level of care/duration of stay  If discussed at Long Length of Stay Meetings, dates discussed:    Comments:

## 2013-03-05 NOTE — Discharge Summary (Signed)
Addendum  Patient seen and examined, chart and data base reviewed.  I agree with the above assessment and plan.  For full details please see Mrs. Algis Downs PA note.  UTI/Pyelonephritis, to complete 14 days of antibiotics.   Clint Lipps, MD [facility] Pager: [phone] 03/05/2013, 1:39 PM

## 2013-03-05 NOTE — Discharge Summary (Addendum)
Physician Discharge Summary  GREY RAKESTRAW ZOX:[id number] DOB: 10-15-1924 DOA: 03/01/2013  PCP: Rayford Halsted, PT  Admit date: 03/01/2013 Discharge date: 03/06/2013   Addendum:  Patient discharged 11/18 but  remained in the hospital overnight waiting for insurance approval for skilled nursing facility.   Time spent: 50  Minutes   Recommendations for Outpatient Follow-up:   Please monitor pulse, BP, and volume status.  Triamterene/HCTZ has been discontinued due to low BP.  Check Dilantin level.  Reported seizure in the ED on admission, but none since.  Check bmet 11/20.  Monitor potassium at it was rising (4.4) at discharge and patient is on spironolactone.  Patient needs treatment for depression, and a palliative medicine consultation at the skilled nursing facility.  Discharge Diagnoses:  Principal Problem:   UTI (urinary tract infection) Active Problems:   Seizures   CHF (congestive heart failure)   Severe sepsis   Discharge Condition: stable.    Diet recommendation: low sodium, soft diet.  Aspiration precautions.   Filed Weights   03/02/13 0900 03/03/13 1104  Weight: 69.8 kg (153 lb 14.1 oz) 71.8 kg (158 lb 4.6 oz)    History of present illness:  79 y.o. male who presented to the ED with fever and elevated WBC. He was initially mentating at his baseline; however, declined abruptly in the ED after receiving fentanyl for pain control. His BP dropped to 70/48, thankfully he recovered with 750 cc NS bolus. He was complaining of pain all over. He was febrile at 102.1 and had a WBC of 23k. His UA was c/w UTI/Pyelo. He reportedly had a seizure during the EDP exam so Keppra was administered.   Hospital Course:  Septic shock due to pyelonephritis  (Temp 102.1 / WBC 23K), presented with blood pressure of 95/63  Showed clinical improvement, blood pressure stable Will need to monitor BP outpatient.    UTI/acute pyelonephritis  Patient was treated with broad spectrum  IV antibiotics.  These were narrowed to maxipime.  Fever resolved.  WBC trended down nicely.  He will be placed on ceftin at discharge for a total of 14 days of antibiotics. Urine culture showed no growth.  Patient had been on antibiotics prior to collection of urine sample.  Acute renal failure  Presented with creatinine of 1.74.  Creatinine improved to 1.0 prior to discharge.  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 EF 25-30% via TTE Oct 2014 w/ grade I DD.  BB/Spironolactone/lasix were resumed at discharge.  Hypotension / HTN  Hypotensive early in the hospital stay BP starting to rebound.  Metoprolol restarted on 11/18 Will request other BP meds be added back as appropriate as an outpatient.  Seizure D/O  Seizure in the ED. Possibly brought on by fever and acute illness.  Received Keppra IV No seizures noted since. Continue dilantin.  Check dilantin level outpatient.  Dementia  Not currently agitated or combative - is alert to place  On 11/18 exam - patient repeatedly states "I want to die" Would recommend treatment for depression and palliative medicine consultation.   Code Status: NO CODE BLUE   Procedures:   Consultations:   Discharge Exam: Filed Vitals:   03/06/13 0642  BP: 130/100  Pulse:   Temp:   Resp:    General: Pt lying in fetal position on his side, does not open eyes. States " I want to die" Lungs: mild rales at bases. No increased work of breathing. Cardiovascular: Regular rate and rhythm without  murmur gallop or rub normal S1 and S2  Abdomen: Nontender, nondistended, soft, bowel sounds positive, no rebound, no ascites, no appreciable mass  Extremities: No significant cyanosis, clubbing, or edema bilateral lower extremities  Discharge Instructions      Discharge Orders   Future Appointments Provider Department Dept Phone   03/12/2013 2:00 PM Marinus Maw, MD [facility] [place] Office  [phone]   07/17/2013 2:30 PM Ronal Fear, NP [facility] [phone]   10/10/2013 1:30 PM Ronal Fear, NP [facility] [phone]   Future Orders Complete By Expires   Diet - low sodium heart healthy  As directed    Diet - low sodium heart healthy  As directed    Increase activity slowly  As directed    Increase activity slowly  As directed        Medication List    STOP taking these medications       cefTRIAXone 1 G injection  Commonly known as:  ROCEPHIN     triamterene-hydrochlorothiazide 37.5-25 MG per tablet  Commonly known as:  MAXZIDE-25      TAKE these medications       acetaminophen 500 MG tablet  Commonly known as:  TYLENOL  Take 1,000 mg by mouth every 6 (six) hours as needed for mild pain.     albuterol 108 (90 BASE) MCG/ACT inhaler  Commonly known as:  PROVENTIL HFA;VENTOLIN HFA  Inhale 2 puffs into the lungs 2 (two) times daily.     albuterol (2.5 MG/3ML) 0.083% nebulizer solution  Commonly known as:  PROVENTIL  Take 2.5 mg by nebulization every 6 (six) hours as needed for shortness of breath.     aspirin EC 81 MG tablet  Take 81 mg by mouth daily.     Calcium-Vitamin D 600-200 MG-UNIT per tablet  Take 1 tablet by mouth 2 (two) times daily.     cefUROXime 500 MG tablet  Commonly known as:  CEFTIN  Take 1 tablet (500 mg total) by mouth 2 (two) times daily with a meal.     docusate sodium 100 MG capsule  Commonly known as:  COLACE  Take 100 mg by mouth 2 (two) times daily as needed for mild constipation.     escitalopram 10 MG tablet  Commonly known as:  LEXAPRO  Take 10 mg by mouth daily.     folic acid 1 MG tablet  Commonly known as:  FOLVITE  Take 1 mg by mouth every morning.     furosemide 40 MG tablet  Commonly known as:  LASIX  Take 40 mg by mouth daily.     guaiFENesin 100 MG/5ML Soln  Commonly known as:  ROBITUSSIN  Take 300 mg by mouth every 4 (four) hours as needed for cough or to loosen  phlegm.     lisinopril 2.5 MG tablet  Commonly known as:  PRINIVIL,ZESTRIL  Take 2.5 mg by mouth daily.     metoprolol 50 MG tablet  Commonly known as:  LOPRESSOR  Take 1 tablet (50 mg total) by mouth 2 (two) times daily.     phenytoin 100 MG ER capsule  Commonly known as:  DILANTIN  Take 100 mg by mouth 2 (two) times daily.     polyethylene glycol packet  Commonly known as:  MIRALAX / GLYCOLAX  Take 17 g by mouth daily as needed for mild constipation.     pravastatin 40 MG tablet  Commonly known as:  PRAVACHOL  Take 40  mg by mouth at bedtime.     sennosides-docusate sodium 8.6-50 MG tablet  Commonly known as:  SENOKOT-S  Take 2 tablets by mouth at bedtime as needed for constipation.     spironolactone 25 MG tablet  Commonly known as:  ALDACTONE  Take 25 mg by mouth daily.       Allergies  Allergen Reactions  . Risedronate Sodium Other (See Comments)    Severe headache  . Penicillins Other (See Comments)  . Pollen Extract Other (See Comments)      The results of significant diagnostics from this hospitalization (including imaging, microbiology, ancillary and laboratory) are listed below for reference.    Significant Diagnostic Studies: US Renal  03/02/2013   CLINICAL DATA:  Urosepsis.  EXAM: RENAL/URINARY TRACT ULTRASOUND COMPLETE  COMPARISON:  None.  FINDINGS: Right Kidney  Length: 9.9 cm, within normal limits. The kidney is slightly echogenic with thinning of the parenchyma. There is no hydronephrosis. No mass lesion is present.  Left Kidney  Length: 10.8 cm, within normal limits. The kidney is mildly echogenic with thinning of the parenchyma. There is no hydronephrosis. No mass lesion is present.  Bladder  A Foley catheter is present.  The bladder is collapsed.  IMPRESSION: 1. Increased echogenicity of the kidneys bilaterally with thinning of the parenchyma suggesting medical renal disease who to 2. No hydronephrosis or focal mass lesion. 3. The urinary bladder is  collapsed about a Foley catheter.   Electronically Signed   By: Gennette Pac M.D.   On: 03/02/2013 09:04   Dg Chest Port 1 View  03/01/2013   CLINICAL DATA:  Fever.  EXAM: PORTABLE CHEST - 1 VIEW  COMPARISON:  01/19/2013.  FINDINGS: Left subclavian pacemaker lead tips project over the right atrium and right ventricle. Trachea is midline. Heart size stable. There is a coarsened appearance of the lung bases, as on multiple prior exams. No airspace consolidation or pleural fluid. Old right clavicle fracture.  IMPRESSION: 1. Bibasilar pulmonary parenchymal coarsening appears chronic and may be due to fibrosis. 2. No acute findings.   Electronically Signed   By: Leanna Battles M.D.   On: 03/01/2013 21:32    Microbiology: Recent Results (from the past 240 hour(s))  CULTURE, BLOOD (ROUTINE X 2)     Status: None   Collection Time    03/01/13  9:15 PM      Result Value Range Status   Specimen Description BLOOD FOREARM LEFT   Final   Special Requests BOTTLES DRAWN AEROBIC AND ANAEROBIC B 10CC R 8CC   Final   Culture  Setup Time     Final   Value: 03/02/2013 05:01     Performed at [facility]   Culture     Final   Value:        BLOOD CULTURE RECEIVED NO GROWTH TO DATE CULTURE WILL BE HELD FOR 5 DAYS BEFORE ISSUING A FINAL NEGATIVE REPORT     Performed at [facility]   Report Status PENDING   Incomplete  CULTURE, BLOOD (ROUTINE X 2)     Status: None   Collection Time    03/01/13 10:00 PM      Result Value Range Status   Specimen Description BLOOD FOREARM RIGHT   Final   Special Requests BOTTLES DRAWN AEROBIC AND ANAEROBIC 10CC   Final   Culture  Setup Time     Final   Value: 03/02/2013 05:00     Performed at [facility]  Culture     Final   Value:        BLOOD CULTURE RECEIVED NO GROWTH TO DATE CULTURE WILL BE HELD FOR 5 DAYS BEFORE ISSUING A FINAL NEGATIVE REPORT     Performed at [facility]   Report Status PENDING   Incomplete  URINE CULTURE      Status: None   Collection Time    03/02/13 12:27 AM      Result Value Range Status   Specimen Description URINE, CATHETERIZED   Final   Special Requests NONE   Final   Culture  Setup Time     Final   Value: 03/02/2013 01:32     Performed at [facility]   Culture     Final   Value: NO GROWTH     Performed at [facility]   Report Status 03/03/2013 FINAL   Final  MRSA PCR SCREENING     Status: Abnormal   Collection Time    03/02/13 12:54 AM      Result Value Range Status   MRSA by PCR POSITIVE (*) NEGATIVE Final   Comment:            The GeneXpert MRSA Assay (FDA     approved for NASAL specimens     only), is one component of a     comprehensive MRSA colonization     surveillance program. It is not     intended to diagnose MRSA     infection nor to guide or     monitor treatment for     MRSA infections.     RESULT CALLED TO, READ BACK BY AND VERIFIED WITH:     CALLED TO RN JUAN CLAUDIO [id number] @0515  THANEY     Labs: Basic Metabolic Panel:  Recent Labs Lab 03/01/13 2116 03/02/13 0426 03/03/13 0348 03/04/13 0626 03/05/13 0615  NA 131* 135 137 137 139  K 4.0 4.0 3.6 3.4* 4.4  CL 94* 99 105 105 108  CO2 24 24 25 19 19   GLUCOSE 167* 135* 95 86 132*  BUN 36* 34* 27* 24* 19  CREATININE 1.74* 1.64* 1.12 1.05 1.07  CALCIUM 9.6 9.0 8.7 8.8 9.1   Liver Function Tests:  Recent Labs Lab 03/01/13 2116 03/04/13 0626  AST 15 17  ALT 11 7  ALKPHOS 118* 100  BILITOT 0.3 0.3  PROT 6.7 5.9*  ALBUMIN 2.6* 2.0*   CBC:  Recent Labs Lab 03/01/13 2116 03/02/13 0426 03/03/13 0348 03/04/13 0626  WBC 23.9* 26.2* 17.6* 11.1*  NEUTROABS 20.4*  --   --   --   HGB 12.3* 11.9* 10.7* 10.2*  HCT 35.0* 34.5* 31.2* 28.2*  MCV 97.5 99.1 99.4 95.3  PLT 284 267 265 312   BNP: BNP (last 3 results)  Recent Labs  01/16/13 1423 01/22/13 0403  PROBNP 7222.0* 2875.0*   CBG:  Recent Labs Lab 03/02/13 0052 03/03/13 1111  GLUCAP 166* 100*      Signed:  Conley Canal [phone]  [facility] 03/06/2013, 11:31 AM  Attending Patient seen and examined, agree with above assessment and plan. Stable for discharge.  S Ghimire

## 2013-03-06 MED ORDER — METOPROLOL TARTRATE 50 MG PO TABS
50.0000 mg | ORAL_TABLET | Freq: Two times a day (BID) | ORAL | Status: DC
  Filled 2013-03-06: qty 1

## 2013-03-06 MED ORDER — METOPROLOL TARTRATE 50 MG PO TABS: 50.0000 mg | ORAL_TABLET | Freq: Two times a day (BID) | ORAL | Status: AC

## 2013-03-06 NOTE — Progress Notes (Signed)
Pt refused to be wiped with 2%Chlorhexidine Gluconate. Pt educated the importance wiping Chlor Hex. Pt remains uncooperative. Will continue to monitor.

## 2013-03-06 NOTE — Clinical Social Work Note (Signed)
CSW has received [employer] authorization. Patient ready for DC back to [facility]. Ambulance requested for 12:00PM. Daughter Nettie Elm states she will be here at the hospital between 11:00AM and 11:30AM.  Roddie Mc, [place], [place], [phone]

## 2013-03-06 NOTE — Progress Notes (Signed)
NURSING PROGRESS NOTE  [name] Holmes [id number] Discharge Data: 03/06/2013 12:41 PM Attending Provider: Clydia Llano, MD WUJ:[id number], Currie Paris, PT     Lenon Ahmadi to be D/C'd Skilled nursing facility per MD order.  Discussed with the patient the After Visit Summary and all questions fully answered. All IV's discontinued with no bleeding noted. All belongings returned to patient for patient to take home.   Last Vital Signs:  Blood pressure 130/100, pulse 96, temperature 97.3 F (36.3 C), temperature source Oral, resp. rate 20, height 5' 6.93" (1.7 m), weight 71.8 kg (158 lb 4.6 oz), SpO2 95.00%.  Discharge Medication List   Medication List    STOP taking these medications       cefTRIAXone 1 G injection  Commonly known as:  ROCEPHIN     triamterene-hydrochlorothiazide 37.5-25 MG per tablet  Commonly known as:  MAXZIDE-25      TAKE these medications       acetaminophen 500 MG tablet  Commonly known as:  TYLENOL  Take 1,000 mg by mouth every 6 (six) hours as needed for mild pain.     albuterol 108 (90 BASE) MCG/ACT inhaler  Commonly known as:  PROVENTIL HFA;VENTOLIN HFA  Inhale 2 puffs into the lungs 2 (two) times daily.     albuterol (2.5 MG/3ML) 0.083% nebulizer solution  Commonly known as:  PROVENTIL  Take 2.5 mg by nebulization every 6 (six) hours as needed for shortness of breath.     aspirin EC 81 MG tablet  Take 81 mg by mouth daily.     Calcium-Vitamin D 600-200 MG-UNIT per tablet  Take 1 tablet by mouth 2 (two) times daily.     cefUROXime 500 MG tablet  Commonly known as:  CEFTIN  Take 1 tablet (500 mg total) by mouth 2 (two) times daily with a meal.     docusate sodium 100 MG capsule  Commonly known as:  COLACE  Take 100 mg by mouth 2 (two) times daily as needed for mild constipation.     escitalopram 10 MG tablet  Commonly known as:  LEXAPRO  Take 10 mg by mouth daily.     folic acid 1 MG tablet  Commonly known as:  FOLVITE  Take 1 mg by mouth  every morning.     furosemide 40 MG tablet  Commonly known as:  LASIX  Take 40 mg by mouth daily.     guaiFENesin 100 MG/5ML Soln  Commonly known as:  ROBITUSSIN  Take 300 mg by mouth every 4 (four) hours as needed for cough or to loosen phlegm.     lisinopril 2.5 MG tablet  Commonly known as:  PRINIVIL,ZESTRIL  Take 2.5 mg by mouth daily.     metoprolol 50 MG tablet  Commonly known as:  LOPRESSOR  Take 1 tablet (50 mg total) by mouth 2 (two) times daily.     phenytoin 100 MG ER capsule  Commonly known as:  DILANTIN  Take 100 mg by mouth 2 (two) times daily.     polyethylene glycol packet  Commonly known as:  MIRALAX / GLYCOLAX  Take 17 g by mouth daily as needed for mild constipation.     pravastatin 40 MG tablet  Commonly known as:  PRAVACHOL  Take 40 mg by mouth at bedtime.     sennosides-docusate sodium 8.6-50 MG tablet  Commonly known as:  SENOKOT-S  Take 2 tablets by mouth at bedtime as needed for constipation.     spironolactone 25  MG tablet  Commonly known as:  ALDACTONE  Take 25 mg by mouth daily.

## 2013-03-08 LAB — CULTURE, BLOOD (ROUTINE X 2)
Culture: NO GROWTH
Culture: NO GROWTH

## 2013-03-12 ENCOUNTER — Encounter: Payer: Self-pay | Admitting: Internal Medicine

## 2013-03-12 ENCOUNTER — Ambulatory Visit (INDEPENDENT_AMBULATORY_CARE_PROVIDER_SITE_OTHER): Payer: Medicare Other | Admitting: Internal Medicine

## 2013-03-12 VITALS — BP 139/82 | HR 60 | Wt 154.0 lb

## 2013-03-12 DIAGNOSIS — Z950 Presence of cardiac pacemaker: Secondary | ICD-10-CM

## 2013-03-12 DIAGNOSIS — I509 Heart failure, unspecified: Secondary | ICD-10-CM

## 2013-03-12 DIAGNOSIS — I495 Sick sinus syndrome: Secondary | ICD-10-CM

## 2013-03-12 LAB — MDC_IDC_ENUM_SESS_TYPE_INCLINIC
Battery Remaining Longevity: 123.6 mo
Battery Voltage: 3.02 V
Implantable Pulse Generator Serial Number: 7523477
Lead Channel Impedance Value: 437.5 Ohm
Lead Channel Impedance Value: 600 Ohm
Lead Channel Pacing Threshold Amplitude: 0.625 V
Lead Channel Pacing Threshold Amplitude: 1 V
Lead Channel Pacing Threshold Pulse Width: 0.4 ms
Lead Channel Setting Pacing Amplitude: 0.875
Lead Channel Setting Pacing Pulse Width: 0.4 ms
Lead Channel Setting Sensing Sensitivity: 2.5 mV

## 2013-03-12 NOTE — Patient Instructions (Signed)
Your physician wants you to follow-up in: 12 months with Dr. Taylor. You will receive a reminder letter in the mail two months in advance. If you don't receive a letter, please call our office to schedule the follow-up appointment.    

## 2013-03-12 NOTE — Assessment & Plan Note (Signed)
His St. Jude device is working normally. We increased his AV delay today to allow for intrinsic conduction.

## 2013-03-12 NOTE — Assessment & Plan Note (Signed)
His heart failure symptoms are well compensated today. I discussed with his daughter the importance of a low sodium diet. He will continue his current medications.

## 2013-03-12 NOTE — Progress Notes (Signed)
HPI [name] Holmes returns today for followup. He is an 79 yo man with a h/o symptomatic bradycardia, s/p PPM insertion, HTN, dementia, diastolic CHF and incontinence. The patient was recently admitted with a UTI and sepsis like sydrome. He had sterile blood cultures. He has chronic peripheral edema. Allergies  Allergen Reactions  . Risedronate Sodium Other (See Comments)    Severe headache  . Penicillins Other (See Comments)  . Pollen Extract Other (See Comments)     Current Outpatient Prescriptions  Medication Sig Dispense Refill  . acetaminophen (TYLENOL) 500 MG tablet Take 1,000 mg by mouth every 6 (six) hours as needed for mild pain.      Marland Kitchen albuterol (PROVENTIL HFA;VENTOLIN HFA) 108 (90 BASE) MCG/ACT inhaler Inhale 2 puffs into the lungs 2 (two) times daily.      Marland Kitchen albuterol (PROVENTIL) (2.5 MG/3ML) 0.083% nebulizer solution Take 2.5 mg by nebulization every 6 (six) hours as needed for shortness of breath.      Marland Kitchen aspirin EC 81 MG tablet Take 81 mg by mouth daily.      . Calcium-Vitamin D 600-200 MG-UNIT per tablet Take 1 tablet by mouth 2 (two) times daily.        . cefUROXime (CEFTIN) 500 MG tablet Take 1 tablet (500 mg total) by mouth 2 (two) times daily with a meal.  20 tablet  0  . docusate sodium (COLACE) 100 MG capsule Take 100 mg by mouth 2 (two) times daily as needed for mild constipation.      Marland Kitchen escitalopram (LEXAPRO) 10 MG tablet Take 10 mg by mouth daily.      . folic acid (FOLVITE) 1 MG tablet Take 1 mg by mouth every morning.        . furosemide (LASIX) 40 MG tablet Take 40 mg by mouth daily.      Marland Kitchen guaiFENesin (ROBITUSSIN) 100 MG/5ML SOLN Take 300 mg by mouth every 4 (four) hours as needed for cough or to loosen phlegm.      Marland Kitchen lisinopril (PRINIVIL,ZESTRIL) 2.5 MG tablet Take 2.5 mg by mouth daily.      . metoprolol (LOPRESSOR) 50 MG tablet Take 1 tablet (50 mg total) by mouth 2 (two) times daily.      . phenytoin (DILANTIN) 100 MG ER capsule Take 100 mg by mouth 2  (two) times daily.      . polyethylene glycol (MIRALAX / GLYCOLAX) packet Take 17 g by mouth daily as needed for mild constipation.      . pravastatin (PRAVACHOL) 40 MG tablet Take 40 mg by mouth at bedtime.      . sennosides-docusate sodium (SENOKOT-S) 8.6-50 MG tablet Take 2 tablets by mouth at bedtime as needed for constipation.      Marland Kitchen spironolactone (ALDACTONE) 25 MG tablet Take 25 mg by mouth daily.       No current facility-administered medications for this visit.     Past Medical History  Diagnosis Date  . Stroke 2005  . Hypertension   . Hyperlipidemia   . Prostate cancer     radiation  . 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 Seizures 10/04/2012    Remote , last seizure in 1994. On Dilantin  100 mg bd po.  . Depression   . Dementia   . Acute CHF 01/16/2013    Dr tat , dr Ladona Ridgel.   . Cardiac pacemaker in situ 02/11/2013    ROS:   All systems reviewed and negative except as  noted in the HPI.   Past Surgical History  Procedure Laterality Date  . Prostate biopsy  2003  . Appendectomy  1939  . Pacemaker insertion  12/03/2012    St. Jude Assurity dual-chamber pacemaker, serial number [id number]     Family History  Problem Relation Age of Onset  . Bipolar disorder Sister 44    psychotic episodes, 1610,96,04 , 98.  . Stroke Mother 102    CVA  . Stroke Father 59    CVA , alcohol     History   Social History  . Marital Status: Widowed    Spouse Name: N/A    Number of Children: N/A  . Years of Education: N/A   Occupational History  . retired     former [employer] employee   Social History Main Topics  . Smoking status: Never Smoker   . Smokeless tobacco: Never Used  . Alcohol Use: No  . Drug Use: No  . Sexual Activity: No   Other Topics Concern  . Not on file   Social History Narrative  . No narrative on file     BP 139/82  Pulse 60  Wt 154 lb (69.854 kg)  Physical Exam:  Chronically ill appearing elderly man, NAD HEENT: Unremarkable Neck:   No JVD, no thyromegally Back:  No CVA tenderness Lungs:  Clear with no wheezes HEART:  Regular rate rhythm, no murmurs, no rubs, no clicks Abd:  soft, positive bowel sounds, no organomegally, no rebound, no guarding Ext:  2 plus pulses, no edema, no cyanosis, no clubbing Skin:  No rashes no nodules Neuro:  CN II through XII intact, motor grossly intact   DEVICE  Normal device function.  See PaceArt for details.   Assess/Plan:

## 2013-07-17 ENCOUNTER — Ambulatory Visit: Payer: Medicare Other | Admitting: Nurse Practitioner

## 2013-10-10 ENCOUNTER — Ambulatory Visit: Payer: Medicare Other | Admitting: Nurse Practitioner

## 2014-03-18 ENCOUNTER — Encounter: Payer: Self-pay | Admitting: *Deleted

## 2014-03-27 ENCOUNTER — Encounter (HOSPITAL_COMMUNITY): Payer: Self-pay | Admitting: Internal Medicine

## 2014-10-28 ENCOUNTER — Encounter: Payer: Self-pay | Admitting: *Deleted

## 2014-11-10 ENCOUNTER — Telehealth: Payer: Self-pay | Admitting: Internal Medicine

## 2014-11-10 NOTE — Telephone Encounter (Signed)
New Message      Pt's niece calling stating that they received a certified letter stated that pt needed to follow up for monitoring of his device. Pt's niece states that Dr. Everitt Amber has been handling the monitoring of the pt's device. Pt is now at [facility] and is not in a condition where he can come out to appts anymore.

## 2015-01-17 DEATH — deceased

## 2015-07-02 IMAGING — CR DG CHEST 1V PORT
1 series · 1 of 1 positions shown · non-contrast
Comparison: 02/25/2011 and 11/08/2010

CLINICAL DATA: Loss of consciousness.

PORTABLE CHEST - 1 VIEW

[AP]
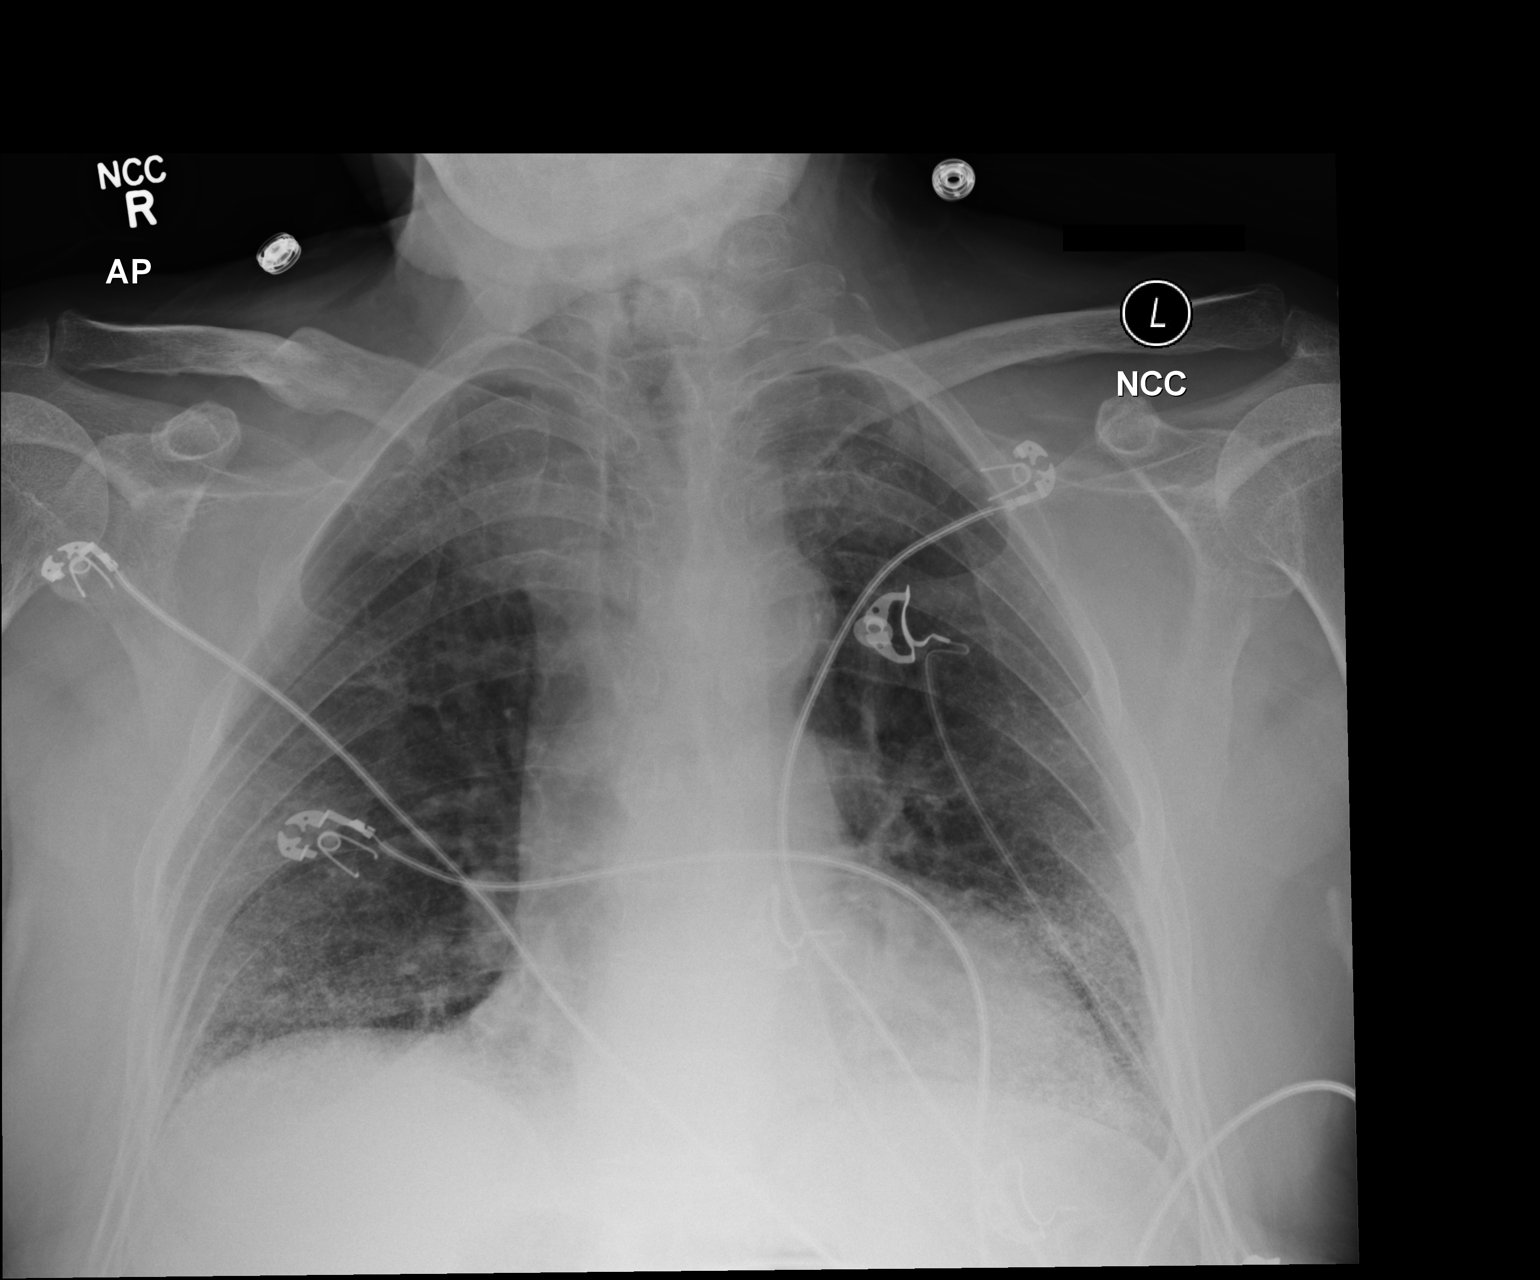

[1 of 1 positions shown; findings below may reference images not displayed]

FINDINGS: Heart size and pulmonary vascularity are normal.  The
patient has chronic interstitial lung disease as at the bases,
possibly representing pulmonary fibrosis.  No acute infiltrates or
effusions.  No acute osseous abnormality.  Old healed fracture of
the right clavicle.
IMPRESSION: No acute abnormalities.  Chronic interstitial lung disease.

## 2015-07-04 IMAGING — CR DG CHEST 1V
1 series · 1 of 1 positions shown · non-contrast
Comparison: Portable chest x-ray of 12/03/2012

CLINICAL DATA: Post pacemaker insertion

CHEST - 1 VIEW

[view not recorded]
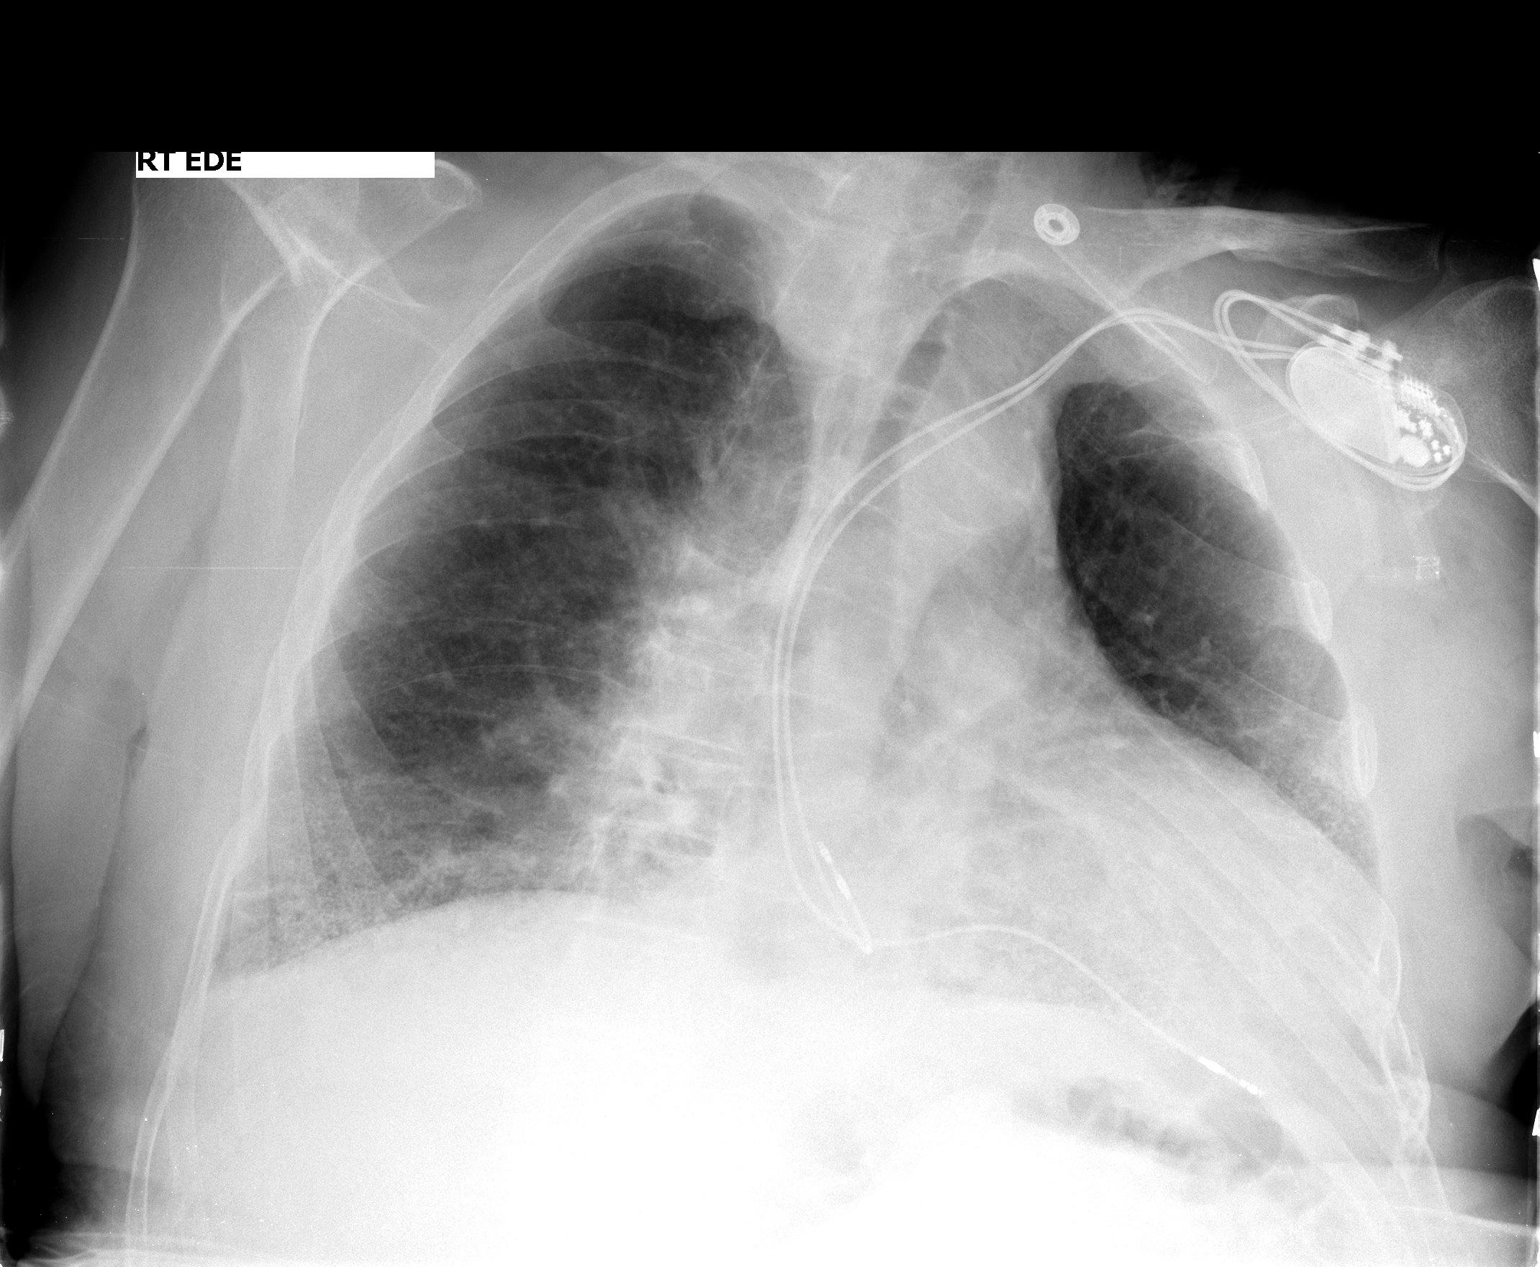

[1 of 1 positions shown; findings below may reference images not displayed]

FINDINGS: There is little change in aeration with basilar
atelectasis remaining.  Cardiomegaly is stable.  A permanent
pacemaker is now present, and no pneumothorax is seen.
IMPRESSION: Dual lead permanent pacemaker present.  Mild basilar atelectasis.

## 2015-08-18 IMAGING — CR DG CHEST 2V
3 series · 3 of 3 positions shown · non-contrast
Comparison: 01/16/2013 and 11/08/2010

CLINICAL DATA: Shortness of breath and chest pain.

EXAM:
CHEST  2 VIEW

[w chest lat (1 of 2)]
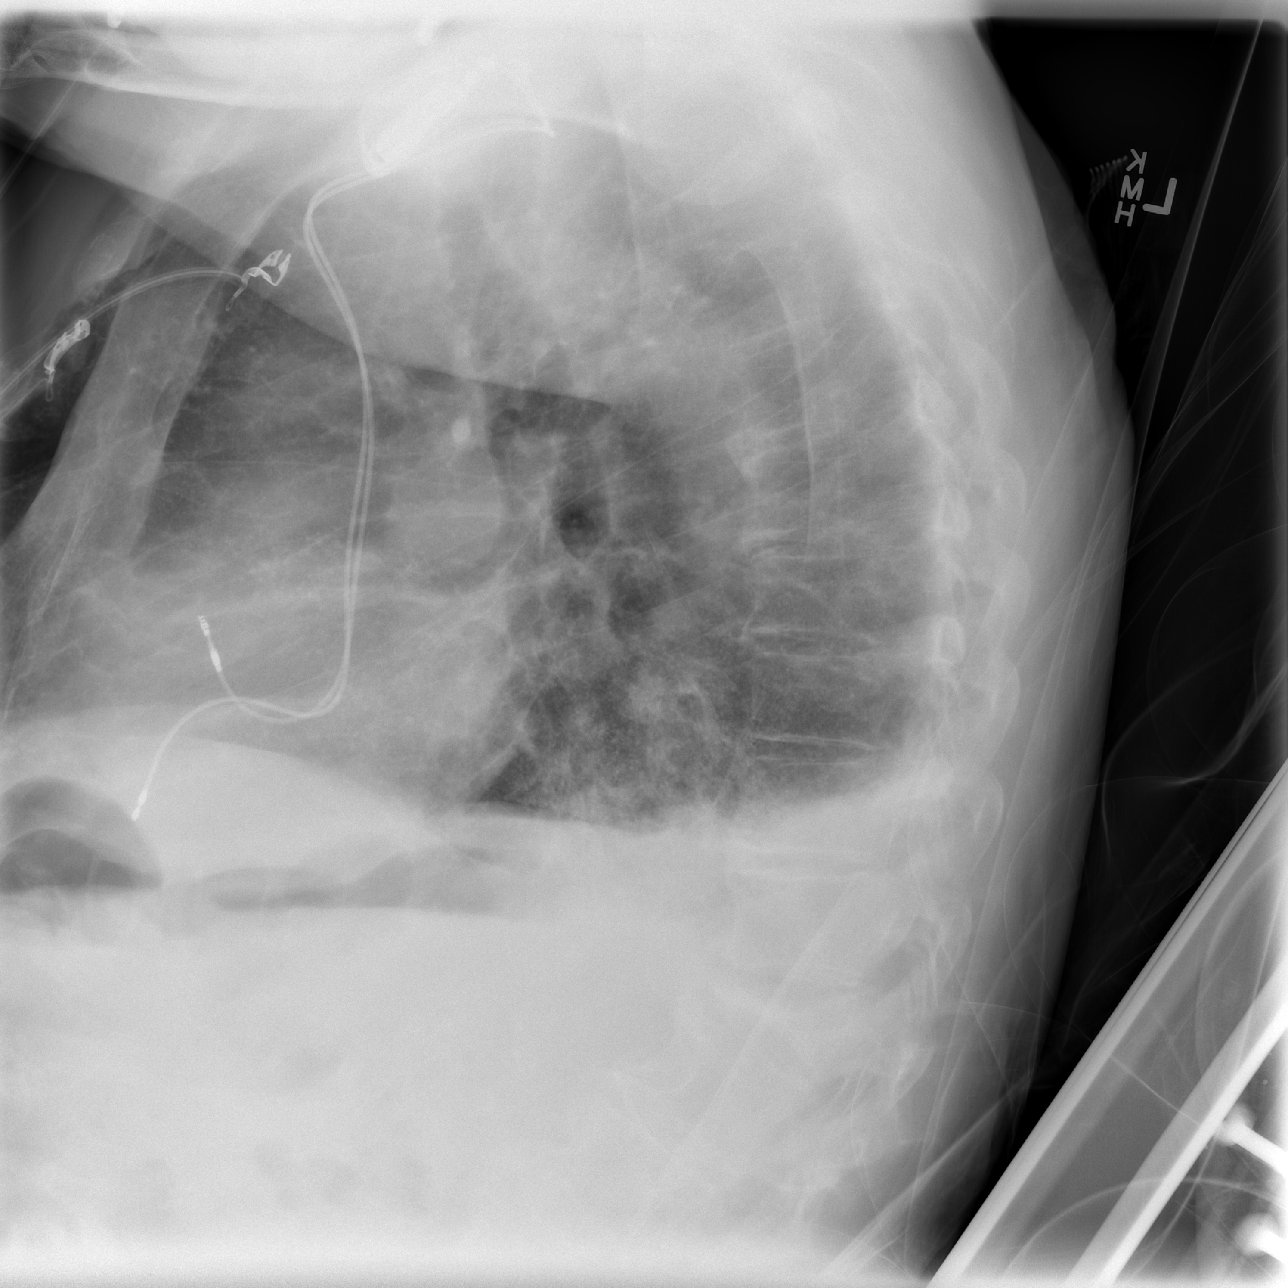

[w chest lat (2 of 2)]
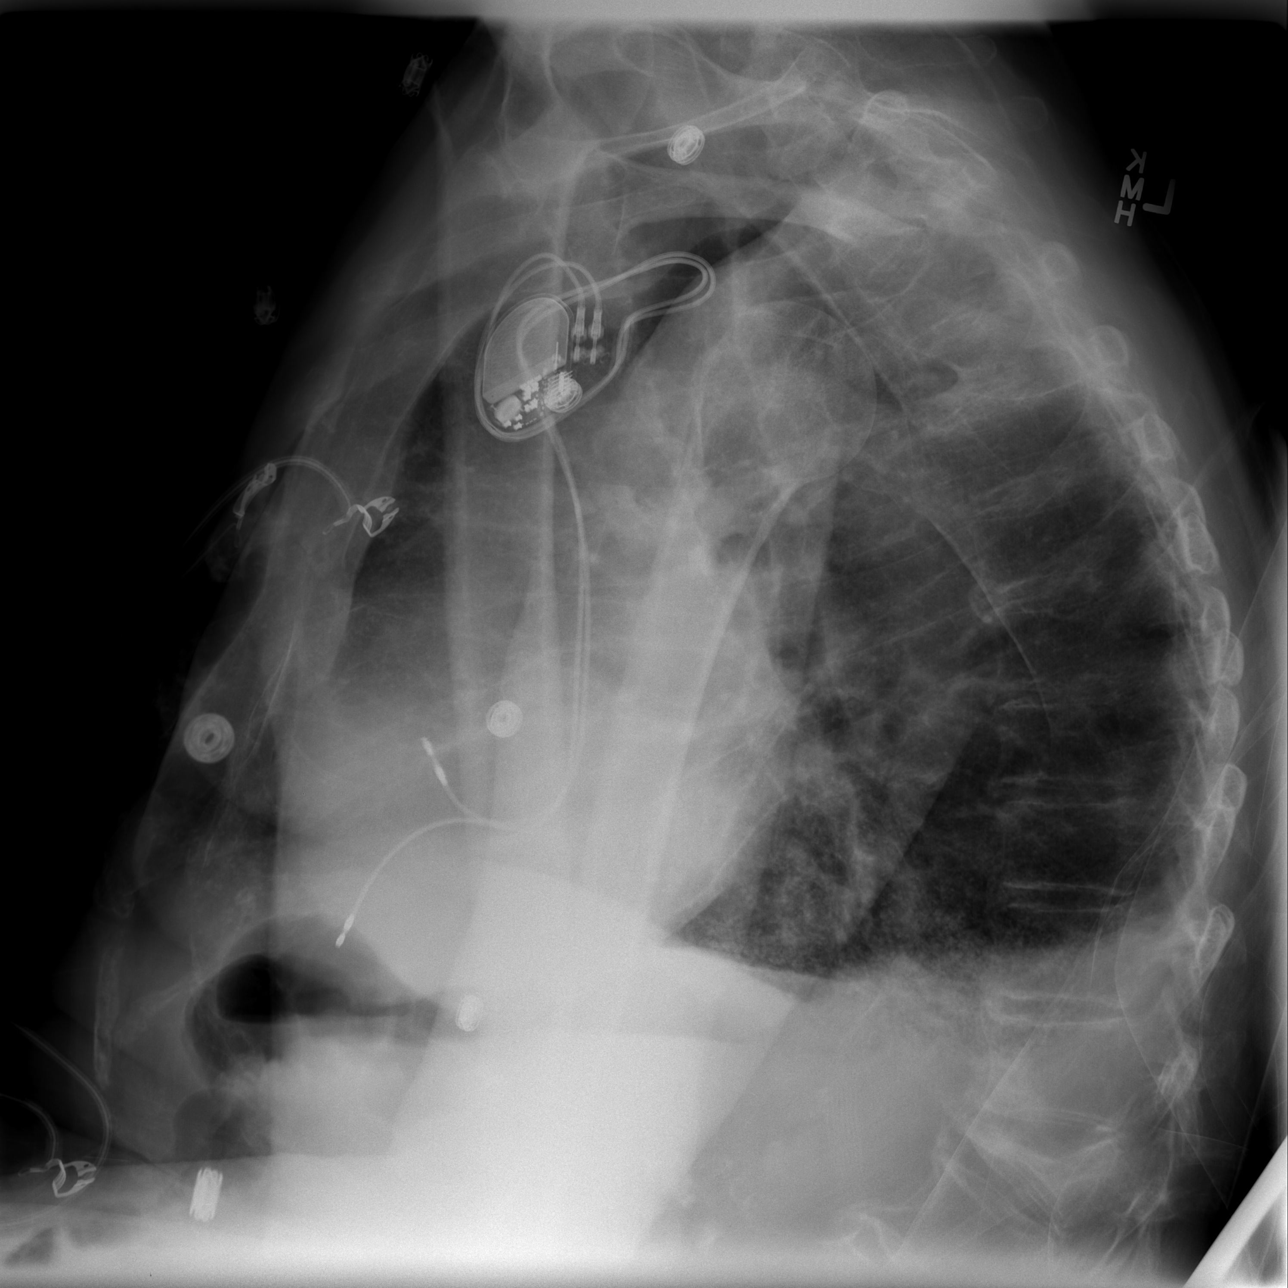

[view not recorded]
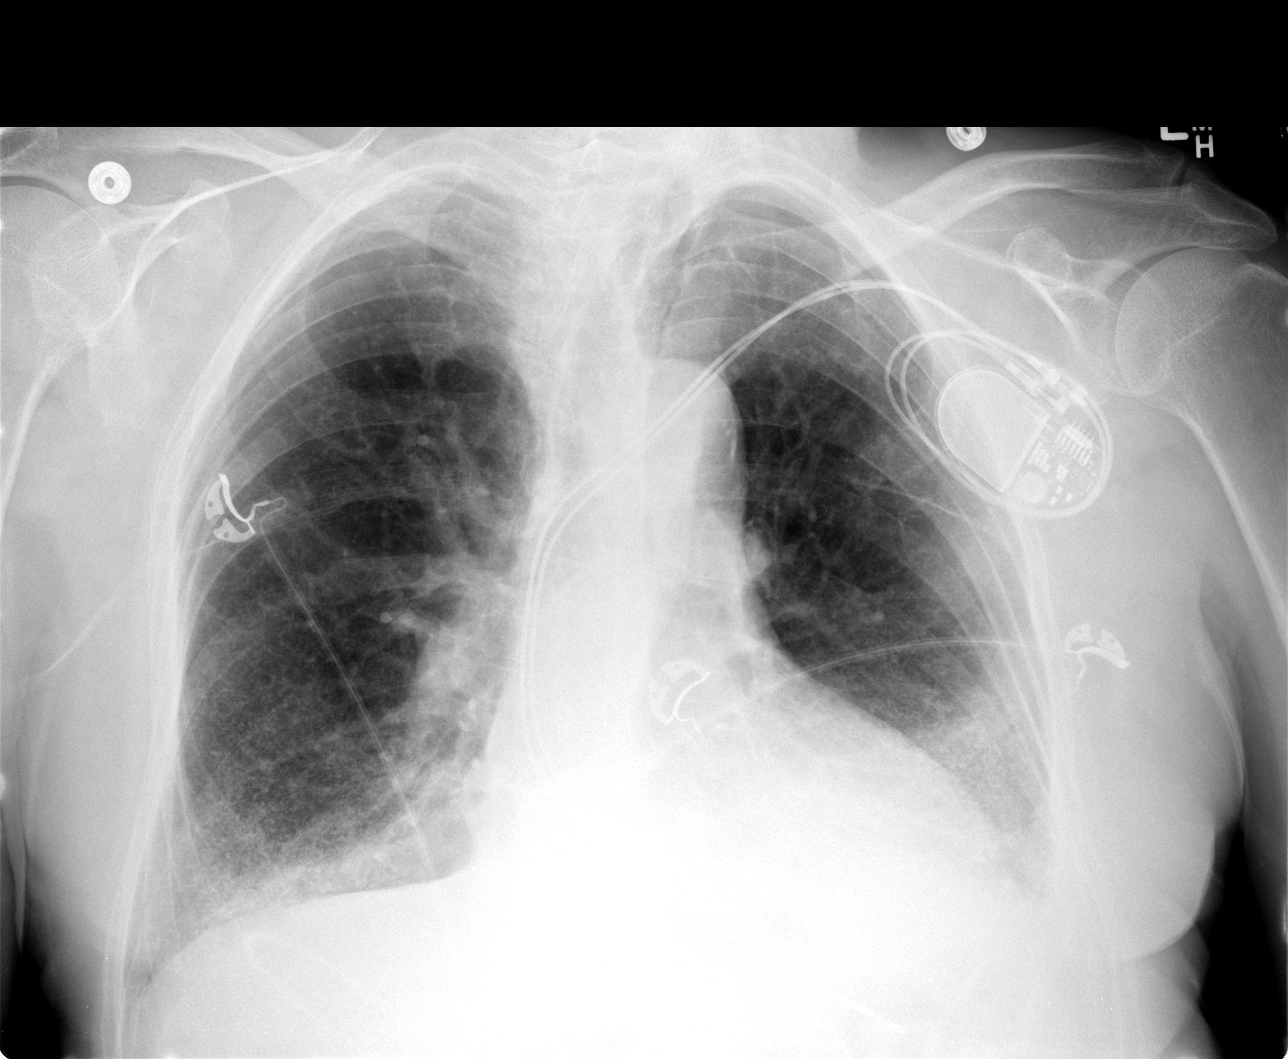

[3 of 3 positions shown; findings below may reference images not displayed]

FINDINGS: Left-sided pacemaker unchanged. The patient is slightly rotated to
the left. There is bibasilar interstitial disease which is chronic.
There is mild bibasilar opacification likely small effusions with
atelectasis unchanged as cannot completely exclude infection in the
lung bases. Cardiomediastinal silhouette and remainder of the exam
is unchanged to include a severe compression fracture over the lower
thoracic spine.
IMPRESSION: Bibasilar opacification compatible with small effusions likely with
associated atelectasis. Cannot completely exclude infection in the
lung bases.
